# Patient Record
Sex: Female | Born: 1960 | Race: White | Hispanic: No | Smoking: Former smoker
Health system: Southern US, Community
[De-identification: ages and names within clinical notes are randomized; demographics above are authoritative.]

## PROBLEM LIST (undated history)

## (undated) DIAGNOSIS — M722 Plantar fascial fibromatosis: Secondary | ICD-10-CM

## (undated) DIAGNOSIS — R7303 Prediabetes: Secondary | ICD-10-CM

## (undated) DIAGNOSIS — K2960 Other gastritis without bleeding: Secondary | ICD-10-CM

## (undated) DIAGNOSIS — G43909 Migraine, unspecified, not intractable, without status migrainosus: Secondary | ICD-10-CM

## (undated) DIAGNOSIS — M810 Age-related osteoporosis without current pathological fracture: Secondary | ICD-10-CM

## (undated) DIAGNOSIS — K449 Diaphragmatic hernia without obstruction or gangrene: Secondary | ICD-10-CM

## (undated) DIAGNOSIS — Z87898 Personal history of other specified conditions: Secondary | ICD-10-CM

## (undated) DIAGNOSIS — M719 Bursopathy, unspecified: Secondary | ICD-10-CM

## (undated) DIAGNOSIS — B029 Zoster without complications: Secondary | ICD-10-CM

## (undated) DIAGNOSIS — E669 Obesity, unspecified: Secondary | ICD-10-CM

## (undated) HISTORY — PX: TONSILLECTOMY: SHX5217

## (undated) HISTORY — DX: Migraine, unspecified, not intractable, without status migrainosus: G43.909

## (undated) HISTORY — DX: Zoster without complications: B02.9

## (undated) HISTORY — PX: CERVIX LESION DESTRUCTION: SHX591

## (undated) HISTORY — DX: Diaphragmatic hernia without obstruction or gangrene: K44.9

## (undated) HISTORY — DX: Bursopathy, unspecified: M71.9

## (undated) HISTORY — DX: Other gastritis without bleeding: K29.60

## (undated) HISTORY — DX: Plantar fascial fibromatosis: M72.2

## (undated) HISTORY — DX: Personal history of other specified conditions: Z87.898

## (undated) HISTORY — DX: Prediabetes: R73.03

## (undated) HISTORY — DX: Age-related osteoporosis without current pathological fracture: M81.0

## (undated) HISTORY — DX: Obesity, unspecified: E66.9

---

## 1989-01-26 DIAGNOSIS — Z87898 Personal history of other specified conditions: Secondary | ICD-10-CM

## 1989-01-26 HISTORY — DX: Personal history of other specified conditions: Z87.898

## 1995-01-27 HISTORY — PX: BREAST EXCISIONAL BIOPSY: SUR124

## 1995-01-27 HISTORY — PX: BREAST SURGERY: SHX581

## 1997-01-26 HISTORY — PX: CERVICAL POLYPECTOMY: SHX88

## 1998-03-08 ENCOUNTER — Other Ambulatory Visit: Admission: RE | Admit: 1998-03-08 | Discharge: 1998-03-08 | Payer: Self-pay | Admitting: Obstetrics and Gynecology

## 1998-08-02 ENCOUNTER — Ambulatory Visit (HOSPITAL_COMMUNITY): Admission: RE | Admit: 1998-08-02 | Discharge: 1998-08-02 | Payer: Self-pay | Admitting: Obstetrics and Gynecology

## 1998-08-02 ENCOUNTER — Encounter (INDEPENDENT_AMBULATORY_CARE_PROVIDER_SITE_OTHER): Payer: Self-pay | Admitting: *Deleted

## 2000-07-21 ENCOUNTER — Other Ambulatory Visit: Admission: RE | Admit: 2000-07-21 | Discharge: 2000-07-21 | Payer: Self-pay | Admitting: Obstetrics and Gynecology

## 2001-07-15 ENCOUNTER — Other Ambulatory Visit: Admission: RE | Admit: 2001-07-15 | Discharge: 2001-07-15 | Payer: Self-pay | Admitting: *Deleted

## 2002-07-31 ENCOUNTER — Other Ambulatory Visit: Admission: RE | Admit: 2002-07-31 | Discharge: 2002-07-31 | Payer: Self-pay | Admitting: Obstetrics and Gynecology

## 2003-08-07 ENCOUNTER — Other Ambulatory Visit: Admission: RE | Admit: 2003-08-07 | Discharge: 2003-08-07 | Payer: Self-pay | Admitting: Obstetrics and Gynecology

## 2004-08-12 ENCOUNTER — Other Ambulatory Visit: Admission: RE | Admit: 2004-08-12 | Discharge: 2004-08-12 | Payer: Self-pay | Admitting: Obstetrics and Gynecology

## 2005-03-17 ENCOUNTER — Other Ambulatory Visit: Admission: RE | Admit: 2005-03-17 | Discharge: 2005-03-17 | Payer: Self-pay | Admitting: Obstetrics & Gynecology

## 2006-04-09 ENCOUNTER — Other Ambulatory Visit: Admission: RE | Admit: 2006-04-09 | Discharge: 2006-04-09 | Payer: Self-pay | Admitting: Obstetrics & Gynecology

## 2007-05-17 ENCOUNTER — Other Ambulatory Visit: Admission: RE | Admit: 2007-05-17 | Discharge: 2007-05-17 | Payer: Self-pay | Admitting: Obstetrics and Gynecology

## 2008-05-22 ENCOUNTER — Other Ambulatory Visit: Admission: RE | Admit: 2008-05-22 | Discharge: 2008-05-22 | Payer: Self-pay | Admitting: Obstetrics and Gynecology

## 2008-06-26 DIAGNOSIS — B029 Zoster without complications: Secondary | ICD-10-CM

## 2008-06-26 HISTORY — DX: Zoster without complications: B02.9

## 2008-11-27 ENCOUNTER — Encounter: Admission: RE | Admit: 2008-11-27 | Discharge: 2008-11-27 | Payer: Self-pay | Admitting: Obstetrics and Gynecology

## 2009-04-25 ENCOUNTER — Encounter: Admission: RE | Admit: 2009-04-25 | Discharge: 2009-04-25 | Payer: Self-pay | Admitting: Orthopedic Surgery

## 2009-12-03 ENCOUNTER — Encounter: Admission: RE | Admit: 2009-12-03 | Discharge: 2009-12-03 | Payer: Self-pay | Admitting: Family Medicine

## 2010-09-22 HISTORY — PX: COLONOSCOPY W/ BIOPSIES: SHX1374

## 2010-09-22 LAB — HM COLONOSCOPY

## 2010-12-15 ENCOUNTER — Other Ambulatory Visit: Payer: Self-pay | Admitting: Family Medicine

## 2010-12-15 DIAGNOSIS — Z1231 Encounter for screening mammogram for malignant neoplasm of breast: Secondary | ICD-10-CM

## 2011-01-05 ENCOUNTER — Ambulatory Visit
Admission: RE | Admit: 2011-01-05 | Discharge: 2011-01-05 | Disposition: A | Discharge status: Home / Self Care | Payer: Commercial Indemnity | Source: Ambulatory Visit | Attending: Family Medicine | Admitting: Family Medicine

## 2011-01-05 DIAGNOSIS — Z1231 Encounter for screening mammogram for malignant neoplasm of breast: Secondary | ICD-10-CM

## 2011-01-07 ENCOUNTER — Other Ambulatory Visit: Payer: Self-pay | Admitting: Family Medicine

## 2011-01-07 DIAGNOSIS — R928 Other abnormal and inconclusive findings on diagnostic imaging of breast: Secondary | ICD-10-CM

## 2011-01-21 ENCOUNTER — Ambulatory Visit
Admission: RE | Admit: 2011-01-21 | Discharge: 2011-01-21 | Disposition: A | Discharge status: Home / Self Care | Payer: Commercial Indemnity | Source: Ambulatory Visit | Attending: Family Medicine | Admitting: Family Medicine

## 2011-01-21 DIAGNOSIS — R928 Other abnormal and inconclusive findings on diagnostic imaging of breast: Secondary | ICD-10-CM

## 2011-01-22 ENCOUNTER — Other Ambulatory Visit: Payer: Self-pay | Admitting: Family Medicine

## 2011-01-22 DIAGNOSIS — R921 Mammographic calcification found on diagnostic imaging of breast: Secondary | ICD-10-CM

## 2011-12-01 ENCOUNTER — Other Ambulatory Visit: Payer: Self-pay | Admitting: Family Medicine

## 2011-12-01 DIAGNOSIS — R921 Mammographic calcification found on diagnostic imaging of breast: Secondary | ICD-10-CM

## 2012-01-06 ENCOUNTER — Ambulatory Visit
Admission: RE | Admit: 2012-01-06 | Discharge: 2012-01-06 | Disposition: A | Discharge status: Home / Self Care | Payer: Commercial Indemnity | Source: Ambulatory Visit | Attending: Family Medicine | Admitting: Family Medicine

## 2012-01-06 DIAGNOSIS — R921 Mammographic calcification found on diagnostic imaging of breast: Secondary | ICD-10-CM

## 2012-09-23 ENCOUNTER — Ambulatory Visit (INDEPENDENT_AMBULATORY_CARE_PROVIDER_SITE_OTHER): Payer: Commercial Indemnity | Admitting: Nurse Practitioner

## 2012-09-23 ENCOUNTER — Encounter: Payer: Self-pay | Admitting: Nurse Practitioner

## 2012-09-23 VITALS — BP 106/66 | HR 60 | Resp 16 | Ht 64.25 in | Wt 185.0 lb

## 2012-09-23 DIAGNOSIS — R109 Unspecified abdominal pain: Secondary | ICD-10-CM

## 2012-09-23 DIAGNOSIS — E559 Vitamin D deficiency, unspecified: Secondary | ICD-10-CM

## 2012-09-23 DIAGNOSIS — R920 Mammographic microcalcification found on diagnostic imaging of breast: Secondary | ICD-10-CM

## 2012-09-23 DIAGNOSIS — Z0000 Encounter for general adult medical examination without abnormal findings: Secondary | ICD-10-CM

## 2012-09-23 DIAGNOSIS — Z01419 Encounter for gynecological examination (general) (routine) without abnormal findings: Secondary | ICD-10-CM

## 2012-09-23 DIAGNOSIS — G8929 Other chronic pain: Secondary | ICD-10-CM

## 2012-09-23 LAB — POCT URINALYSIS DIPSTICK
Bilirubin, UA: NEGATIVE
Blood, UA: NEGATIVE
Glucose, UA: NEGATIVE
Ketones, UA: NEGATIVE

## 2012-09-23 MED ORDER — VITAMIN D (ERGOCALCIFEROL) 1.25 MG (50000 UNIT) PO CAPS: 50000.0000 [IU] | ORAL_CAPSULE | ORAL | Status: DC

## 2012-09-23 NOTE — Progress Notes (Signed)
Patient ID: Kayla Lewis, female   DOB: 1961/01/17, 52 y.o.   MRN: [id number] 52 y.o. G2P2002 Divorced Caucasian Fe here for annual exam.  Some increase in vaginal dryness. Not dating or sexually active.  Recent problems with Sciatica of right hip X 6 months.  Has been in PT and working with Systems analyst. Left flank pain for 2 months.  No urinary symptoms.  Remote history of renal calculi in her late 20's.  No LMP recorded. Patient is postmenopausal.          Sexually active: no  The current method of family planning is abstinence.    Exercising: yes  Gym/ health club routine includes cardio and light weights. 3 times per week Smoker:  no  Health Maintenance: Pap:  09/21/11, WNL HR HPV neg MMG:  01/06/12 order placed for repeat Mammo Colonoscopy:  09/22/10, repeat 5 years BMD:   2-3 years ago osteopenia at hip - done at Ortho and Dr. Tresa Endo Shingles Vaccine: 03/26/2009 TDaP:  03/26/09 Labs: HB 13.7  Urine: negative     No past medical history on file.  No past surgical history on file.  No current outpatient prescriptions on file.   No current facility-administered medications for this visit.    No family history on file.  ROS:  Pertinent items are noted in HPI.  Otherwise, a comprehensive ROS was negative.  Exam:   There were no vitals taken for this visit.    Ht Readings from Last 3 Encounters:  No data found for Ht    General appearance: alert, cooperative and appears stated age Head: Normocephalic, without obvious abnormality, atraumatic Neck: no adenopathy, supple, symmetrical, trachea midline and thyroid normal to inspection and palpation Lungs: clear to auscultation bilaterally Breasts: normal appearance, no masses or tenderness Heart: regular rate and rhythm Abdomen: soft, non-tender; no masses,  no organomegaly, no flank pain Extremities: extremities normal, atraumatic, no cyanosis or edema Skin: Skin color, texture, turgor normal. No rashes or lesions Lymph  nodes: Cervical, supraclavicular, and axillary nodes normal. No abnormal inguinal nodes palpated Neurologic: Grossly normal   Pelvic: External genitalia:  no lesions              Urethra:  normal appearing urethra with no masses, tenderness or lesions              Bartholin's and Skene's: normal                 Vagina: normal appearing vagina with normal color and discharge, no lesions              Cervix: anteverted              Pap taken: no Bimanual Exam:  Uterus:  normal size, contour, position, consistency, mobility, non-tender              Adnexa: no mass, fullness, tenderness               Rectovaginal: Confirms               Anus:  normal sphincter tone, no lesions  A:  Well Woman with normal exam  Postmenopausal  Atrophic vaginitis  Right Sciatica  Left flank pain  history of Vit D deficiency  History of abnormal Mammo with calcifications on the left  P:   Pap smear as per guidelines   Mammogram due 12/14 - order is placed for diagnostic  Refill Vit D RX - will depend on labs  Will  get Urine CS and follow - she is planning to follow with PCP in 2 wk's.  Counseled on breast self exam, osteoporosis, adequate intake of calcium and vitamin D, diet and exercise, Kegel's exercises return annually or prn  An After Visit Summary was printed and given to the patient.

## 2012-09-23 NOTE — Patient Instructions (Signed)

## 2012-09-24 LAB — URINE CULTURE
Colony Count: NO GROWTH
Organism ID, Bacteria: NO GROWTH

## 2012-09-24 LAB — VITAMIN D 25 HYDROXY (VIT D DEFICIENCY, FRACTURES): Vit D, 25-Hydroxy: 49 ng/mL (ref 30–89)

## 2012-09-26 NOTE — Progress Notes (Signed)
Encounter reviewed by Dr. Brook Silva.  

## 2012-10-01 ENCOUNTER — Other Ambulatory Visit: Payer: Self-pay | Admitting: Nurse Practitioner

## 2013-01-13 ENCOUNTER — Ambulatory Visit
Admission: RE | Admit: 2013-01-13 | Discharge: 2013-01-13 | Disposition: A | Discharge status: Home / Self Care | Payer: Commercial Indemnity | Source: Ambulatory Visit | Attending: Nurse Practitioner | Admitting: Nurse Practitioner

## 2013-01-13 DIAGNOSIS — R920 Mammographic microcalcification found on diagnostic imaging of breast: Secondary | ICD-10-CM

## 2013-01-13 DIAGNOSIS — Z01419 Encounter for gynecological examination (general) (routine) without abnormal findings: Secondary | ICD-10-CM

## 2013-09-28 ENCOUNTER — Ambulatory Visit (INDEPENDENT_AMBULATORY_CARE_PROVIDER_SITE_OTHER): Payer: Commercial Indemnity | Admitting: Nurse Practitioner

## 2013-09-28 ENCOUNTER — Encounter: Payer: Self-pay | Admitting: Nurse Practitioner

## 2013-09-28 VITALS — BP 120/78 | HR 68 | Ht 64.25 in | Wt 190.0 lb

## 2013-09-28 DIAGNOSIS — M899 Disorder of bone, unspecified: Secondary | ICD-10-CM

## 2013-09-28 DIAGNOSIS — E559 Vitamin D deficiency, unspecified: Secondary | ICD-10-CM

## 2013-09-28 DIAGNOSIS — M949 Disorder of cartilage, unspecified: Secondary | ICD-10-CM

## 2013-09-28 DIAGNOSIS — Z1211 Encounter for screening for malignant neoplasm of colon: Secondary | ICD-10-CM

## 2013-09-28 DIAGNOSIS — M8580 Other specified disorders of bone density and structure, unspecified site: Secondary | ICD-10-CM

## 2013-09-28 DIAGNOSIS — Z01419 Encounter for gynecological examination (general) (routine) without abnormal findings: Secondary | ICD-10-CM

## 2013-09-28 NOTE — Progress Notes (Signed)
Patient ID: Kayla Lewis, female   DOB: 11-23-1960, 53 y.o.   MRN: [id number] 53 y.o. G13P2002 Divorced Caucasian Fe here for annual exam.  She is not dating or SA.  Still having migraine headaches.  Now cutting back on glutin, dairy and breads.  Some times related to changes in weather pressure.   Patient's last menstrual period was 05/27/2007.          Sexually active: no  The current method of family planning is abstinence.  Exercising: yes Gym/ health club routine includes cardio and light weights, running and walking 5 days per week. Still unable to lose weight. Smoker: no   Health Maintenance:  Pap: 09/21/11, WNL HR HPV neg  MMG: 01/13/13, Diagnostic, Bi-Rads 2: benign findings, repeat in one year Colonoscopy: 09/22/10, repeat 5 years  BMD: 2-3 years ago osteopenia at hip - done at Ortho and Dr. Tresa Endo  Shingles Vaccine: 03/26/2009  TDaP: 03/26/09  Labs:  PCP takes care of all labs.   reports that she quit smoking about 15 years ago. Her smoking use included Cigarettes. She has a 1.25 pack-year smoking history. She has never used smokeless tobacco. She reports that she does not drink alcohol or use illicit drugs.  Past Medical History  Diagnosis Date  . Migraine age 46's    with aura  . History of abnormal Pap smear 1991  . Shingles outbreak 06/2008    abdomen    Past Surgical History  Procedure Laterality Date  . Cervix lesion destruction    . Tonsillectomy  age 56  . Breast surgery Left 1997    lumpectomy benign  . Cervical polypectomy  1999  . Colonoscopy w/ biopsies  09/22/10    1 polyp benign recheck in 10 years.    Current Outpatient Prescriptions  Medication Sig Dispense Refill  . cetirizine (ZYRTEC) 10 MG tablet Take 10 mg by mouth daily.      Marland Kitchen LOVAZA 1 G capsule Take 1 capsule by mouth daily.      . Magnesium 250 MG TABS Take 1 tablet by mouth daily.      . Multiple Vitamin (MULTIVITAMIN) tablet Take 1 tablet by mouth daily.      . niacin 250 MG tablet Take 250 mg  by mouth daily with breakfast.      . sulfamethoxazole-trimethoprim (BACTRIM DS) 800-160 MG per tablet Take 1 tablet by mouth daily.      . SUMAtriptan (IMITREX) 100 MG tablet Take 1 tablet by mouth as needed.      . topiramate (TOPAMAX) 200 MG tablet Take 1 tablet by mouth daily.       No current facility-administered medications for this visit.    Family History  Problem Relation Age of Onset  . COPD Father   . Depression Sister   . Hypertension Sister   . Obesity Sister   . Hyperlipidemia Sister   . Heart disease Sister   . Asthma Sister   . Obesity Sister     ROS:  Pertinent items are noted in HPI.  Otherwise, a comprehensive ROS was negative.  Exam:   BP 120/78  Pulse 68  Ht 5' 4.25" (1.632 m)  Wt 190 lb (86.183 kg)  BMI 32.36 kg/m2  LMP 05/27/2007 Height: 5' 4.25" (163.2 cm)  Ht Readings from Last 3 Encounters:  09/28/13 5' 4.25" (1.632 m)  09/23/12 5' 4.25" (1.632 m)    General appearance: alert, cooperative and appears stated age Head: Normocephalic, without obvious abnormality, atraumatic Neck:  no adenopathy, supple, symmetrical, trachea midline and thyroid normal to inspection and palpation Lungs: clear to auscultation bilaterally Breasts: normal appearance, no masses or tenderness Heart: regular rate and rhythm Abdomen: soft, non-tender; no masses,  no organomegaly Extremities: extremities normal, atraumatic, no cyanosis or edema Skin: Skin color, texture, turgor normal. No rashes or lesions Lymph nodes: Cervical, supraclavicular, and axillary nodes normal. No abnormal inguinal nodes palpated Neurologic: Grossly normal   Pelvic: External genitalia:  no lesions              Urethra:  normal appearing urethra with no masses, tenderness or lesions              Bartholin's and Skene's: normal                 Vagina: normal appearing vagina with normal color and discharge, no lesions              Cervix: anteverted              Pap taken: No. Bimanual Exam:   Uterus:  normal size, contour, position, consistency, mobility, non-tender              Adnexa: no mass, fullness, tenderness               Rectovaginal: Confirms               Anus:  normal sphincter tone, no lesions  A:  Well Woman with normal exam  Postmenopausal no HRT  Atrophic vaginitis declines vag Estrogen  Right Sciatica - better  History of Vit D deficiency   History of abnormal Mammo with calcifications on the left last Mammo was diagnostic, in December will be screening  History of Osteopenia - GI intolerance to Fosamax, and non compliance to nasal spray.   P:   Reviewed health and wellness pertinent to exam  Pap smear not taken today  Mammogram due 12/15  Will get a repeat BMD and try to get previous one done at [facility] for comparison.  IFOB given today - desires screening yearly  She will be sure and get Vit D checked at PCP  Counseled on breast self exam, mammography screening, adequate intake of calcium and vitamin D, diet and exercise, Kegel's exercises return annually or prn  An After Visit Summary was printed and given to the patient.

## 2013-09-28 NOTE — Patient Instructions (Addendum)

## 2013-10-02 NOTE — Progress Notes (Signed)
Encounter reviewed by Dr. Brook Silva.  

## 2013-11-08 DIAGNOSIS — N951 Menopausal and female climacteric states: Secondary | ICD-10-CM | POA: Insufficient documentation

## 2013-11-08 DIAGNOSIS — M25551 Pain in right hip: Secondary | ICD-10-CM | POA: Insufficient documentation

## 2013-11-08 DIAGNOSIS — L719 Rosacea, unspecified: Secondary | ICD-10-CM | POA: Insufficient documentation

## 2013-11-08 DIAGNOSIS — K3189 Other diseases of stomach and duodenum: Secondary | ICD-10-CM | POA: Insufficient documentation

## 2013-11-08 DIAGNOSIS — K2940 Chronic atrophic gastritis without bleeding: Secondary | ICD-10-CM | POA: Insufficient documentation

## 2013-11-08 DIAGNOSIS — J309 Allergic rhinitis, unspecified: Secondary | ICD-10-CM | POA: Insufficient documentation

## 2013-11-08 DIAGNOSIS — M8580 Other specified disorders of bone density and structure, unspecified site: Secondary | ICD-10-CM | POA: Insufficient documentation

## 2013-11-08 DIAGNOSIS — K30 Functional dyspepsia: Secondary | ICD-10-CM | POA: Insufficient documentation

## 2013-11-08 HISTORY — DX: Pain in right hip: M25.551

## 2013-11-23 ENCOUNTER — Telehealth: Payer: Self-pay | Admitting: Nurse Practitioner

## 2013-11-23 NOTE — Telephone Encounter (Signed)
Since we do not deal with weight management and give med's for weight loss - advise her to see PCP and discuss further.

## 2013-11-23 NOTE — Telephone Encounter (Signed)
PCP takes care of labs - make sure she gets thyroid checked.

## 2013-11-23 NOTE — Telephone Encounter (Signed)
Spoke with patient. Patient states "My biggest concern lately has been my weight. I spoke with Patty some about this at my annual but not about how it was such a concern. I have been logging my food, working out with a trainer 2 times a week, and going to the gym 5 days a week. I am still getting bigger. I had my cholesterol checked at work at it was 226. With everything I am doing nothing is changing. Is there something she could give me to help me lose weight?" Advised patient will send a message over to Lauro FranklinPatricia Rolen-Grubb, FNP for review and advise and return call with recommendations. Patient is agreeable and verbalizes understanding.

## 2013-11-23 NOTE — Telephone Encounter (Signed)
Patient called during lunch requesting a nurse to call her

## 2013-11-24 NOTE — Telephone Encounter (Signed)
Spoke with patient. Advised of message as seen below from Lauro FranklinPatricia Rolen-Grubb, FNP. Patient is agreeable and verbalizes understanding. Will call PCP and have thyroid level checked.  Routing to provider for final review. Patient agreeable to disposition. Will close encounter

## 2013-11-27 ENCOUNTER — Encounter: Payer: Self-pay | Admitting: Nurse Practitioner

## 2013-12-19 ENCOUNTER — Other Ambulatory Visit: Payer: Self-pay

## 2013-12-19 DIAGNOSIS — Z1231 Encounter for screening mammogram for malignant neoplasm of breast: Secondary | ICD-10-CM

## 2014-01-16 ENCOUNTER — Ambulatory Visit
Admission: RE | Admit: 2014-01-16 | Discharge: 2014-01-16 | Disposition: A | Discharge status: Home / Self Care | Payer: Commercial Indemnity | Source: Ambulatory Visit

## 2014-01-16 DIAGNOSIS — Z1231 Encounter for screening mammogram for malignant neoplasm of breast: Secondary | ICD-10-CM

## 2014-04-02 ENCOUNTER — Telehealth: Payer: Self-pay | Admitting: *Deleted

## 2014-04-02 NOTE — Telephone Encounter (Deleted)
Charted in incorrect patient chart.

## 2014-04-02 NOTE — Telephone Encounter (Deleted)
John, thanks.  Rene KocherRegina, could you, please, notify this pt concerning scheduling colonoscopy. She is leaving the country on 4/19.We have reviewed procedure notes from Dr Kinnie ScalesMedoff and from Dr Hilbert OdorGromig and she has been cleared by Jonny RuizJohn to undergo the procedure in [facility] where her insurancewill cover. Thanx.       Previous Messages     Left a message for patient to call back.

## 2014-05-28 LAB — FECAL OCCULT BLOOD, IMMUNOCHEMICAL: IMMUNOLOGICAL FECAL OCCULT BLOOD TEST: NEGATIVE

## 2014-05-28 NOTE — Addendum Note (Signed)
Addended by: Eliezer BottomJOHNSON, DAVINA J on: 05/28/2014 12:59 PM   Modules accepted: Orders

## 2014-05-30 ENCOUNTER — Telehealth: Payer: Self-pay | Admitting: Nurse Practitioner

## 2014-05-30 NOTE — Telephone Encounter (Signed)
Patient calling to check on the status of her "stool sample test."

## 2014-05-30 NOTE — Telephone Encounter (Signed)
Notes Recorded by Roanna BanningPatricia R Grubb, FNP on 05/28/2014 at 1:40 PM Please let pt. Know that IFOB was negative.  Left message to call Amariona Rathje at [phone].

## 2014-05-30 NOTE — Telephone Encounter (Signed)
agree

## 2014-05-30 NOTE — Telephone Encounter (Signed)
Spoke with patient. Advised of results as seen below. Patient is agreeable and verbalizes understanding.  Routing to provider for final review. Patient agreeable to disposition. Patient aware MD will review message and nurse will return call with any additional instructions or change of disposition. Will close encounter.

## 2014-08-10 DIAGNOSIS — R5383 Other fatigue: Secondary | ICD-10-CM | POA: Insufficient documentation

## 2014-10-04 ENCOUNTER — Ambulatory Visit: Payer: Commercial Indemnity | Admitting: Nurse Practitioner

## 2014-10-17 ENCOUNTER — Encounter: Payer: Self-pay | Admitting: Nurse Practitioner

## 2014-10-17 ENCOUNTER — Ambulatory Visit (INDEPENDENT_AMBULATORY_CARE_PROVIDER_SITE_OTHER): Payer: Commercial Indemnity | Admitting: Nurse Practitioner

## 2014-10-17 VITALS — BP 112/76 | HR 72 | Ht 64.25 in | Wt 190.0 lb

## 2014-10-17 DIAGNOSIS — Z01419 Encounter for gynecological examination (general) (routine) without abnormal findings: Secondary | ICD-10-CM

## 2014-10-17 DIAGNOSIS — Z1211 Encounter for screening for malignant neoplasm of colon: Secondary | ICD-10-CM

## 2014-10-17 DIAGNOSIS — M810 Age-related osteoporosis without current pathological fracture: Secondary | ICD-10-CM | POA: Diagnosis not present

## 2014-10-17 DIAGNOSIS — Z0000 Encounter for general adult medical examination without abnormal findings: Secondary | ICD-10-CM

## 2014-10-17 LAB — POCT URINALYSIS DIPSTICK
BILIRUBIN UA: NEGATIVE
GLUCOSE UA: NEGATIVE
KETONES UA: NEGATIVE
Leukocytes, UA: NEGATIVE
Nitrite, UA: NEGATIVE
PH UA: 6
Protein, UA: NEGATIVE
RBC UA: NEGATIVE
Urobilinogen, UA: NEGATIVE

## 2014-10-17 NOTE — Patient Instructions (Signed)

## 2014-10-17 NOTE — Progress Notes (Signed)
Patient ID: ICESS BERTONI, female   DOB: [birth year], 54 y.o.   MRN: [id number] 54 y.o. G55P2002 Divorced  Caucasian Fe here for annual exam.  She feels well other than weight gain despite diet, exercise, and personal trainer 3 times a week.  She talked with PCP and he felt she was depressed - she does not feel that way.  She has discussed with family members and they do not feel she is depressed.  She is not dating and has had a nice summer.  Some mild vaso symptoms.  Patient's last menstrual period was 05/27/2007.          Sexually active: No.  The current method of family planning is none.    Exercising: Yes.    cardio and weight training 5 times per week Smoker:  no  Health Maintenance: Pap: 09/21/11, WNL HR HPV neg  MMG: 01/16/14, 3D, Bi-Rads 1:  Negative  Colonoscopy: 09/22/10, benign polyp, repeat in 10 years BMD:  09/22/11, T-Score -2.1 S / -2.7 left proximal femur / -2.0 left femoral neck Shingles Vaccine: 03/26/2009  TDaP: 03/26/09  Labs: PCP   Urine:  negative   reports that she quit smoking about 16 years ago. Her smoking use included Cigarettes. She has a 1.25 pack-year smoking history. She has never used smokeless tobacco. She reports that she does not drink alcohol or use illicit drugs.  Past Medical History  Diagnosis Date  . Migraine age 61's    with aura  . History of abnormal Pap smear 1991  . Shingles outbreak 06/2008    abdomen    Past Surgical History  Procedure Laterality Date  . Cervix lesion destruction    . Tonsillectomy  age 47  . Breast surgery Left 1997    lumpectomy benign  . Cervical polypectomy  1999  . Colonoscopy w/ biopsies  09/22/10    1 polyp benign recheck in 10 years.    Current Outpatient Prescriptions  Medication Sig Dispense Refill  . Calcium Carbonate-Vit D-Min (CALTRATE MINIS PLUS MINERALS) 300-800 MG-UNIT TABS Take 2 tablets by mouth daily.    . cetirizine (ZYRTEC) 10 MG tablet Take 10 mg by mouth daily.    . Cholecalciferol (VITAMIN  D3) 2000 UNITS capsule Take 1 capsule by mouth daily.    . Cyanocobalamin (RA VITAMIN B-12 TR) 1000 MCG TBCR Take 1 tablet by mouth daily.    Marland Kitchen LOVAZA 1 G capsule Take 1 capsule by mouth daily.    . Magnesium 250 MG TABS Take 1 tablet by mouth daily.    . metroNIDAZOLE (METROGEL) 0.75 % gel Apply topically as needed. roscea    . Multiple Vitamin (MULTIVITAMIN) tablet Take 1 tablet by mouth daily.    . niacin 250 MG tablet Take 250 mg by mouth daily with breakfast.    . rizatriptan (MAXALT) 10 MG tablet Take 10 mg by mouth as needed.    . topiramate (TOPAMAX) 200 MG tablet Take 1 tablet by mouth daily.     No current facility-administered medications for this visit.    Family History  Problem Relation Age of Onset  . COPD Father   . Depression Sister   . Hypertension Sister   . Obesity Sister   . Hyperlipidemia Sister   . Heart disease Sister   . Asthma Sister   . Obesity Sister     ROS:  Pertinent items are noted in HPI.  Otherwise, a comprehensive ROS was negative.  Exam:   BP 112/76 mmHg  Pulse 72  Ht 5' 4.25" (1.632 m)  Wt 190 lb (86.183 kg)  BMI 32.36 kg/m2  LMP 05/27/2007 Height: 5' 4.25" (163.2 cm) Ht Readings from Last 3 Encounters:  10/17/14 5' 4.25" (1.632 m)  09/28/13 5' 4.25" (1.632 m)  09/23/12 5' 4.25" (1.632 m)    General appearance: alert, cooperative and appears stated age Head: Normocephalic, without obvious abnormality, atraumatic Neck: no adenopathy, supple, symmetrical, trachea midline and thyroid normal to inspection and palpation Lungs: clear to auscultation bilaterally Breasts: normal appearance, no masses or tenderness Heart: regular rate and rhythm Abdomen: soft, non-tender; no masses,  no organomegaly Extremities: extremities normal, atraumatic, no cyanosis or edema Skin: Skin color, texture, turgor normal. No rashes or lesions Lymph nodes: Cervical, supraclavicular, and axillary nodes normal. No abnormal inguinal nodes palpated Neurologic:  Grossly normal   Pelvic: External genitalia:  Very white lesion on right labia minora - after rubbing the area to make sure no underlying lesion or blister it becomes more pink.  Consulted with Dr. Oscar La and will recheck this in a month.                 Urethra:  normal appearing urethra with no masses, tenderness or lesions              Bartholin's and Skene's: normal                 Vagina: normal appearing vagina with normal color and discharge, no lesions              Cervix: anteverted              Pap taken: Yes.   Bimanual Exam:  Uterus:  normal size, contour, position, consistency, mobility, non-tender              Adnexa: no mass, fullness, tenderness               Rectovaginal: Confirms               Anus:  normal sphincter tone, no lesions  Chaperone present: yes  A:  Well Woman with normal exam  Postmenopausal no HRT Atrophic vaginitis declines vag Estrogen ? LSA of right vulva History of Vit D deficiency  History calcifications of the left breast with normal  Mammo 12/15 History of Osteopenia/ osteoporosis of the hip - GI intolerance to Fosamax, and non compliance to nasal spray.   P:   Reviewed health and wellness pertinent to exam  Pap smear as above  Mammogram is due 12/16  Will check PTH and calcium levels today  IFOB is given  She will fax her labs from PCP and I will review and add labs if necessary - will add FSH.  She will return in 1 month and have a visit with Dr. Oscar La to look at right vulva again to see if she needs biopsy.  In the meantime she was instructed to use Vaseline prn  Counseled on breast self exam, mammography screening, adequate intake of calcium and vitamin D, diet and exercise, Kegel's exercises return annually or prn  An After Visit Summary was printed and given to the patient.

## 2014-10-18 LAB — PTH, INTACT AND CALCIUM
CALCIUM: 9.6 mg/dL (ref 8.4–10.5)
PTH: 62 pg/mL (ref 14–64)

## 2014-10-19 ENCOUNTER — Telehealth: Payer: Self-pay | Admitting: Nurse Practitioner

## 2014-10-19 ENCOUNTER — Other Ambulatory Visit: Payer: Self-pay | Admitting: Nurse Practitioner

## 2014-10-19 DIAGNOSIS — M810 Age-related osteoporosis without current pathological fracture: Secondary | ICD-10-CM

## 2014-10-19 LAB — T3, FREE: T3, Free: 2.8 pg/mL (ref 2.3–4.2)

## 2014-10-19 LAB — T4: T4, Total: 9.2 ug/dL (ref 4.5–12.0)

## 2014-10-19 LAB — IPS PAP TEST WITH HPV

## 2014-10-19 NOTE — Addendum Note (Signed)
Addended by: Luisa Dago on: 10/19/2014 10:31 AM   Modules accepted: Orders

## 2014-10-19 NOTE — Telephone Encounter (Signed)
Pt. Is notified that we did get labs from PCP.  A few labs that we wanted to do for osteoporosis was not included and will be added to labs done on Wednesday.  She is notified that the PTH and calcium levels were normal but will await other labs and send her a note about our recommendations then.  She is OK with plan.

## 2014-10-20 LAB — VITAMIN D 25 HYDROXY (VIT D DEFICIENCY, FRACTURES): VIT D 25 HYDROXY: 33 ng/mL (ref 30–100)

## 2014-10-20 LAB — FOLLICLE STIMULATING HORMONE: FSH: 79.6 m[IU]/mL

## 2014-10-21 NOTE — Progress Notes (Signed)
Encounter reviewed by Dr. Brook Amundson C. Silva.  

## 2014-10-22 ENCOUNTER — Telehealth: Payer: Self-pay | Admitting: Nurse Practitioner

## 2014-10-22 ENCOUNTER — Encounter: Payer: Self-pay | Admitting: Nurse Practitioner

## 2014-10-22 NOTE — Telephone Encounter (Signed)
I can't find a recent DEXA in the chart. The last DEXA I can find was in 2013, it showed osteoporosis. Is this the DEXA she is coming to discuss, if not can you find me the report. Thanks

## 2014-10-22 NOTE — Telephone Encounter (Signed)
Spoke with patient. Advised per Ria Comment, FNP's note she would like patient to have consult with MD in the office to discuss BMD results and further treatment to decrease bone loss in the future. Patient has a follow up appointment with Dr.Jertson scheduled for 10/24 for something separate. Would like to consult with Dr.Jertson regarding BMD as to not have to switch back and forth between MD's. Appointment scheduled for BMD consult for 9/29 at 3 pm with Dr.Jerston. Agreeable to date and time.  Routing to provider for final review. Patient agreeable to disposition. Will close encounter.

## 2014-10-22 NOTE — Telephone Encounter (Signed)
Patient says Kayla Lewis want her to see Dr Edward Jolly about her bone density. Patient is not sure if she should have one done or just coming in to discuss results.

## 2014-10-22 NOTE — Telephone Encounter (Signed)
Left message to call Kaitlyn at [phone]. 

## 2014-10-23 NOTE — Telephone Encounter (Signed)
Per Ria Comment, FNP OV note from 10/17/2014 last BMD was performed in 2013 which revealed results below. Patient has a GI intolerance to Fosamax and non compliance to nasal spray. We checked her PTH and Calcium at her 9/21 visit both which were normal. Concerns are regarding why her BMD scores are so low at her age with these normal levels.  History of Osteopenia/ osteoporosis of the hip - GI intolerance to Fosamax, and non compliance to nasal spray.  Encounter previously closed.

## 2014-10-25 ENCOUNTER — Encounter: Payer: Self-pay | Admitting: Obstetrics and Gynecology

## 2014-10-25 ENCOUNTER — Ambulatory Visit (INDEPENDENT_AMBULATORY_CARE_PROVIDER_SITE_OTHER): Payer: Commercial Indemnity | Admitting: Obstetrics and Gynecology

## 2014-10-25 VITALS — BP 100/72 | HR 68 | Resp 14 | Wt 192.0 lb

## 2014-10-25 DIAGNOSIS — M8580 Other specified disorders of bone density and structure, unspecified site: Secondary | ICD-10-CM

## 2014-10-25 DIAGNOSIS — M810 Age-related osteoporosis without current pathological fracture: Secondary | ICD-10-CM

## 2014-10-25 HISTORY — DX: Other specified disorders of bone density and structure, unspecified site: M85.80

## 2014-10-25 LAB — BASIC METABOLIC PANEL
BUN: 19 mg/dL (ref 7–25)
CHLORIDE: 105 mmol/L (ref 98–110)
CO2: 27 mmol/L (ref 20–31)
Calcium: 9.5 mg/dL (ref 8.6–10.4)
Creat: 0.97 mg/dL (ref 0.50–1.05)
Glucose, Bld: 94 mg/dL (ref 65–99)
POTASSIUM: 4.6 mmol/L (ref 3.5–5.3)
Sodium: 138 mmol/L (ref 135–146)

## 2014-10-25 NOTE — Progress Notes (Addendum)
Patient ID: Kayla Lewis, female   DOB: [birth year], 54 y.o.   MRN: [id number] GYNECOLOGY  VISIT   HPI: 54 y.o.   Divorced  Caucasian  female   G2P2002 with Patient's last menstrual period was 05/27/2007.   here to discuss her osteoporosis  The patient was perimenopausal in her last 30's, LMP in her early 48's. She used to smoke, not for 10 years, only smoked socially for 5-6 years. No long term steroid use, not hypothyroid. She had muscle aches on fosomax. She was on for a couple of weeks.  GYNECOLOGIC HISTORY: Patient's last menstrual period was 05/27/2007. Contraception: Post menopause Menopausal hormone therapy: N/A        OB History    Gravida Para Term Preterm AB TAB SAB Ectopic Multiple Living   0 0 0 0 0 0 2         There are no active problems to display for this patient.   Past Medical History  Diagnosis Date  . Migraine age 50's    with aura  . History of abnormal Pap smear 1991  . Shingles outbreak 06/2008    abdomen    Past Surgical History  Procedure Laterality Date  . Cervix lesion destruction    . Tonsillectomy  age 39  . Breast surgery Left 1997    lumpectomy benign  . Cervical polypectomy  1999  . Colonoscopy w/ biopsies  09/22/10    1 polyp benign recheck in 10 years.    Current Outpatient Prescriptions  Medication Sig Dispense Refill  . Calcium Carbonate-Vit D-Min (CALTRATE MINIS PLUS MINERALS) 300-800 MG-UNIT TABS Take 2 tablets by mouth daily.    . cetirizine (ZYRTEC) 10 MG tablet Take 10 mg by mouth daily.    . Cholecalciferol (VITAMIN D3) 2000 UNITS capsule Take 1 capsule by mouth daily.    . Cyanocobalamin (RA VITAMIN B-12 TR) 1000 MCG TBCR Take 1 tablet by mouth daily.    Marland Kitchen LOVAZA 1 G capsule Take 1 capsule by mouth daily.    . Magnesium 250 MG TABS Take 1 tablet by mouth daily.    . metroNIDAZOLE (METROGEL) 0.75 % gel Apply topically as needed. roscea    . Multiple Vitamin (MULTIVITAMIN) tablet Take 1 tablet by mouth daily.    . niacin  250 MG tablet Take 250 mg by mouth daily with breakfast.    . rizatriptan (MAXALT) 10 MG tablet Take 10 mg by mouth as needed.    . SUMAtriptan (IMITREX) 100 MG tablet     . topiramate (TOPAMAX) 200 MG tablet Take 1 tablet by mouth daily.     No current facility-administered medications for this visit.     ALLERGIES: Codeine and Penicillins  Family History  Problem Relation Age of Onset  . COPD Father   . Depression Sister   . Hypertension Sister   . Obesity Sister   . Hyperlipidemia Sister   . Heart disease Sister   . Asthma Sister   . Obesity Sister     Social History   Social History  . Marital Status: Divorced    Spouse Name: N/A  . Number of Children: N/A  . Years of Education: N/A   Occupational History  . Not on file.   Social History Main Topics  . Smoking status: Former Smoker -- 0.25 packs/day for 5 years    Types: Cigarettes    Quit date: 01/26/1998  . Smokeless tobacco: Never Used     Comment: social  .  Alcohol Use: No  . Drug Use: No  . Sexual Activity: No   Other Topics Concern  . Not on file   Social History Narrative    Review of Systems  All other systems reviewed and are negative.   PHYSICAL EXAMINATION:    BP 100/72 mmHg  Pulse 68  Resp 14  Wt 192 lb (87.091 kg)  LMP 05/27/2007    General appearance: alert, cooperative and appears stated age  ASSESSMENT Osteoporosis Elevated creatinine on last blood work Otherwise normal CMP, CBC, TSH, vit D was just above normal at 33 Normal PTH  PLAN Repeat DEXA She hasn't tolerated Fosomax in the past Discussed prolia, she is concerned about side effects and inability to stop symptoms for 6 months Consider referral to hematology Discussed calcium 1200 mg a day Increase vit D by 1,000 IU a day Continue weight bearing exercise Discussed that because of her age and health she is not at as great a risk of fracture as she will be in the future.   An After Visit Summary was printed and  given to the patient.  15  minutes face to face time of which over 50% was spent in counseling.    Addendum: Above should read consider referral to rheumatology, not hematology

## 2014-10-25 NOTE — Patient Instructions (Signed)
Add Vit D 1,000 IU a day to your current intake of vit D

## 2014-10-29 ENCOUNTER — Telehealth: Payer: Self-pay | Admitting: Nurse Practitioner

## 2014-10-29 NOTE — Telephone Encounter (Signed)
Patient says someone called her but did not leave a message. No telephone call in system.

## 2014-10-30 ENCOUNTER — Telehealth: Payer: Self-pay | Admitting: Obstetrics and Gynecology

## 2014-10-30 NOTE — Telephone Encounter (Signed)
SHERRAL DIROCCO 10/30/2014  Telephone MRN:  [id number]   Description: 54 year old female Provider: Romualdo Bolk, MD Department: [facility]      Call Documentation      Romualdo Bolk, MD at 10/30/2014 10:11 AM    Status: Signed      Expand All Collapse All    Called the patient, reviewed normal labs. She was given reading information on osteoporosis at her last visit, hasn't had a chance to read it yet. After her DEXA we will further discuss. She hasn't tolerated Fosomax in the past, is concerned about prolia lasting for 6 months and possible side effects. I mentioned the option of Evista, discussed that it is not good at building bone, but can help prevent further loss. Explained it's a SERM, risk of blood clots. Will talk after her DEXA is back.

## 2014-10-30 NOTE — Telephone Encounter (Signed)
Patient returning your call Dr. Oscar La. Best # to reach [phone]

## 2014-10-30 NOTE — Telephone Encounter (Signed)
Called the patient, reviewed normal labs. She was given reading information on osteoporosis at her last visit, hasn't had a chance to read it yet. After her DEXA we will further discuss. She hasn't tolerated Fosomax in the past, is concerned about prolia lasting for 6 months and possible side effects. I mentioned the option of Evista, discussed that it is not good at building bone, but can help prevent further loss. Explained it's a SERM, risk of blood clots. Will talk after her DEXA is back.

## 2014-11-19 ENCOUNTER — Ambulatory Visit (INDEPENDENT_AMBULATORY_CARE_PROVIDER_SITE_OTHER): Payer: Commercial Indemnity | Admitting: Obstetrics and Gynecology

## 2014-11-19 ENCOUNTER — Encounter: Payer: Self-pay | Admitting: Obstetrics and Gynecology

## 2014-11-19 VITALS — BP 118/70 | HR 76 | Resp 14 | Wt 196.0 lb

## 2014-11-19 DIAGNOSIS — N762 Acute vulvitis: Secondary | ICD-10-CM | POA: Diagnosis not present

## 2014-11-19 DIAGNOSIS — N9089 Other specified noninflammatory disorders of vulva and perineum: Secondary | ICD-10-CM

## 2014-11-19 MED ORDER — BETAMETHASONE VALERATE 0.1 % EX OINT: TOPICAL_OINTMENT | CUTANEOUS | Status: DC

## 2014-11-19 NOTE — Progress Notes (Signed)
Patient ID: Kayla SinningLinda H Lewis, female   DOB: December 14, 1960, 54 y.o.   MRN: [id number] GYNECOLOGY  VISIT   HPI: 54 y.o.   Divorced  Caucasian  female   G2P2002 with Patient's last menstrual period was 05/27/2007.   here for 1 month recheck vulvar. The patient has noted intermittent vulvar itching or irritation. Last month she was noted to have some skin whittening on the right vulva. She has noticed some irritation on the right since then. She uses Vaseline daily.  She hasn't had her DEXA yet.  GYNECOLOGIC HISTORY: Patient's last menstrual period was 05/27/2007. Contraception:post menopause  Menopausal hormone therapy: N/A        OB History    Gravida Para Term Preterm AB TAB SAB Ectopic Multiple Living   2 2 2  0 0 0 0 0 0 2         Patient Active Problem List   Diagnosis Date Noted  . Osteoporosis 10/25/2014    Past Medical History  Diagnosis Date  . Migraine age 54's    with aura  . History of abnormal Pap smear 1991  . Shingles outbreak 06/2008    abdomen    Past Surgical History  Procedure Laterality Date  . Cervix lesion destruction    . Tonsillectomy  age 54  . Breast surgery Left 1997    lumpectomy benign  . Cervical polypectomy  1999  . Colonoscopy w/ biopsies  09/22/10    1 polyp benign recheck in 10 years.    Current Outpatient Prescriptions  Medication Sig Dispense Refill  . Cholecalciferol (VITAMIN D3) 3000 UNITS TABS Take 3,000 Units by mouth daily.    . Calcium Carbonate-Vit D-Min (CALTRATE MINIS PLUS MINERALS) 300-800 MG-UNIT TABS Take 2 tablets by mouth daily.    . cetirizine (ZYRTEC) 10 MG tablet Take 10 mg by mouth daily.    . Cyanocobalamin (RA VITAMIN B-12 TR) 1000 MCG TBCR Take 1 tablet by mouth daily.    Marland Kitchen. LOVAZA 1 G capsule Take 1 capsule by mouth daily.    . Magnesium 250 MG TABS Take 1 tablet by mouth daily.    . metroNIDAZOLE (METROGEL) 0.75 % gel Apply topically as needed. roscea    . Multiple Vitamin (MULTIVITAMIN) tablet Take 1 tablet by mouth  daily.    . naproxen (NAPROSYN) 500 MG tablet Take 500 mg by mouth 2 (two) times daily with a meal.  1  . niacin 250 MG tablet Take 250 mg by mouth daily with breakfast.    . RESTASIS 0.05 % ophthalmic emulsion INSTILL 1 DROP INTO AFFECTED EYE(S) BY OPHTHALMIC ROUTE EVERY 12 HOURS  1  . rizatriptan (MAXALT) 10 MG tablet Take 10 mg by mouth as needed.    . SUMAtriptan (IMITREX) 100 MG tablet     . topiramate (TOPAMAX) 200 MG tablet Take 1 tablet by mouth daily.     No current facility-administered medications for this visit.     ALLERGIES: Codeine and Penicillins  Family History  Problem Relation Age of Onset  . COPD Father   . Depression Sister   . Hypertension Sister   . Obesity Sister   . Hyperlipidemia Sister   . Heart disease Sister   . Asthma Sister   . Obesity Sister     Social History   Social History  . Marital Status: Divorced    Spouse Name: N/A  . Number of Children: N/A  . Years of Education: N/A   Occupational History  . Not on  file.   Social History Main Topics  . Smoking status: Former Smoker -- 0.25 packs/day for 5 years    Types: Cigarettes    Quit date: 01/26/1998  . Smokeless tobacco: Never Used     Comment: social  . Alcohol Use: No  . Drug Use: No  . Sexual Activity: No   Other Topics Concern  . Not on file   Social History Narrative    Review of Systems  Genitourinary:       Small white area in right vulvar   All other systems reviewed and are negative.   PHYSICAL EXAMINATION:    BP 118/70 mmHg  Pulse 76  Resp 14  Wt 196 lb (88.905 kg)  LMP 05/27/2007    General appearance: alert, cooperative and appears stated age  Pelvic: External genitalia:  On the inner/lower right labia minora, the skin is white and slightly raised, irregular borders. No agglutination or fissures.               Urethra:  normal appearing urethra with no masses, tenderness or lesions              Bartholins and Skenes: normal                 Vulvar  biopsy: The risks of the procedure were reviewed with the patient and a consent was signed. The area was cleansed with betadine and injected with 1% lidocaine. A 3 mm punch biopsy was used to remove a circular piece of tissue. The defect was closed with 4-0 vicryl. The patient tolerated the procedure well.    Chaperone was present for exam.  ASSESSMENT Vulvar lesion/irritation Vulvitis  PLAN Vulvar biopsy done Will treat with steroid ointment   An After Visit Summary was printed and given to the patient.    Addendum: we will set up her DEXA for her, we have discussed treatment, waiting for further information.

## 2014-11-20 ENCOUNTER — Telehealth: Payer: Self-pay | Admitting: Emergency Medicine

## 2014-11-20 NOTE — Telephone Encounter (Signed)
-----   Message from Romualdo BolkJill Evelyn Jertson, MD sent at 11/19/2014  4:40 PM EDT ----- The patient is suppose to have a dexa for osteopenia, please make sure this gets scheduled.  Thanks, Noreene LarssonJill

## 2014-11-20 NOTE — Telephone Encounter (Signed)
Calling patient to schedule Bone Density testing. Message left to return call to [facility] at [phone].    [place].  [facility]. [facility]

## 2014-11-20 NOTE — Telephone Encounter (Signed)
Patient returned call. She would like to schedule Bone Density at [facility]. Patient preference as this is close to her work.   Order form signed by Dr. Oscar LaJertson and faxed to [facility] at [phone]. Requested they call patient directly per her preference to know what is available. Fax confirmation received.  Routing to provider for final review. Patient agreeable to disposition. Will close encounter.

## 2014-11-26 ENCOUNTER — Telehealth: Payer: Self-pay | Admitting: Obstetrics and Gynecology

## 2014-11-26 NOTE — Telephone Encounter (Signed)
Spoke with patient. Patient asking when she can expect her stitches to dissolve. Advised patient it can take up to 2 weeks for the stitches to dissolve. Patient is agreeable. Denies any current discomfort. States the area is a little tender when she touches it, but no current discomfort. Aware if the stitches begin to bother her we can schedule an office visit to have them removed. Patient is agreeable.  Routing to provider for final review. Patient agreeable to disposition. Will close encounter.

## 2014-11-26 NOTE — Telephone Encounter (Signed)
Patient calling with questions about "dissolvable stitches."

## 2014-12-11 ENCOUNTER — Other Ambulatory Visit: Payer: Self-pay

## 2014-12-11 DIAGNOSIS — Z1231 Encounter for screening mammogram for malignant neoplasm of breast: Secondary | ICD-10-CM

## 2015-01-22 ENCOUNTER — Ambulatory Visit
Admission: RE | Admit: 2015-01-22 | Discharge: 2015-01-22 | Disposition: A | Discharge status: Home / Self Care | Payer: Managed Care, Other (non HMO) | Source: Ambulatory Visit

## 2015-01-22 DIAGNOSIS — Z1231 Encounter for screening mammogram for malignant neoplasm of breast: Secondary | ICD-10-CM

## 2015-02-19 ENCOUNTER — Telehealth: Payer: Self-pay | Admitting: Emergency Medicine

## 2015-02-19 NOTE — Telephone Encounter (Signed)
Chief Complaint  Patient presents with  . Appointment    Bone Density follow up with [facility]   Call to [facility]. Patient has not scheduled bone density.   Scheduler confirms they have order available from Dr. Oscar La.   They will contact her directly to schedule.   Will close encounter.

## 2015-03-27 ENCOUNTER — Encounter: Payer: Self-pay | Admitting: Rehabilitative and Restorative Service Providers"

## 2015-03-27 ENCOUNTER — Ambulatory Visit
Payer: Managed Care, Other (non HMO) | Attending: Orthopedic Surgery | Admitting: Rehabilitative and Restorative Service Providers"

## 2015-03-27 DIAGNOSIS — Z7409 Other reduced mobility: Secondary | ICD-10-CM | POA: Diagnosis present

## 2015-03-27 DIAGNOSIS — M25611 Stiffness of right shoulder, not elsewhere classified: Secondary | ICD-10-CM | POA: Diagnosis present

## 2015-03-27 DIAGNOSIS — M25511 Pain in right shoulder: Secondary | ICD-10-CM | POA: Insufficient documentation

## 2015-03-27 DIAGNOSIS — R293 Abnormal posture: Secondary | ICD-10-CM | POA: Diagnosis present

## 2015-03-27 NOTE — Patient Instructions (Addendum)
Axial Extension (Chin Tuck)    Pull chin in and lengthen back of neck. Hold _10___ seconds while counting out loud. Repeat __5__ times. Do _several ___ sessions per day.   Shoulder Blade Squeeze   Can use noodle along spine for tactile cue Rotate shoulders back, then squeeze shoulder blades down and back. Hold 10 sec Repeat _10___ times. Do _several___ sessions per day.  Scapula Adduction With Pectoralis Stretch: Low - Standing   Shoulders at 45 hands even with shoulders, keeping weight through legs, shift weight forward until you feel pull or stretch through the front of your chest. Hold _30__ seconds. Do _3__ times, _2-4__ times per day.   Scapula Adduction With Pectoralis Stretch: Mid-Range - Standing   Shoulders at 90 elbows even with shoulders, keeping weight through legs, shift weight forward until you feel pull or strength through the front of your chest. Hold __30_ seconds. Do _3__ times, __2-4_ times per day.   Scapula Adduction With Pectoralis Stretch: High - Standing   Shoulders at 120 hands up high on the doorway, keeping weight on feet, shift weight forward until you feel pull or stretch through the front of your chest. Hold _30__ seconds. Do _3__ times, _2-3__ times per day.  ELBOW: Biceps - Standing    Standing in doorway(can use counter top about waist height), place one hand on wall, elbow straight. Lean forward. Hold __30_ seconds. __3_ reps per set, _2-3_ sets per day  Self massage using ~4 inch rubber ball  Stretch pecs Work posterior shoulder girdle   TENS UNIT: This is helpful for muscle pain and spasm.   Search and Purchase a TENS 7000 2nd edition at www.tenspros.com. It should be less than $30.     TENS unit instructions: Do not shower or bathe with the unit on Turn the unit off before removing electrodes or batteries If the electrodes lose stickiness add a drop of water to the electrodes after they are disconnected from the unit and  place on plastic sheet. If you continued to have difficulty, call the TENS unit company to purchase more electrodes. Do not apply lotion on the skin area prior to use. Make sure the skin is clean and dry as this will help prolong the life of the electrodes. After use, always check skin for unusual red areas, rash or other skin difficulties. If there are any skin problems, does not apply electrodes to the same area. Never remove the electrodes from the unit by pulling the wires. Do not use the TENS unit or electrodes other than as directed. Do not change electrode placement without consultating your therapist or physician. Keep 2 fingers with between each electrode.

## 2015-03-27 NOTE — Therapy (Signed)
[facility] Outpatient Rehabilitation [facility] [address]  [address] [place], [place], [address] Phone: [phone]   Fax:  [phone]  Physical Therapy Evaluation  Patient Details  Name: Kayla Lewis MRN: [id number] Date of Birth: 03-26-1960 Referring Provider: Dr. Beverely Low  Encounter Date: 03/27/2015      PT End of Session - 03/27/15 0853    Visit Number 1   Number of Visits 12   Date for PT Re-Evaluation 05/08/15   PT Start Time 0802   PT Stop Time 0900   PT Time Calculation (min) 58 min   Activity Tolerance Patient tolerated treatment well      Past Medical History  Diagnosis Date  . Migraine age 60's    with aura  . History of abnormal Pap smear 1991  . Shingles outbreak 06/2008    abdomen    Past Surgical History  Procedure Laterality Date  . Cervix lesion destruction    . Tonsillectomy  age 85  . Breast surgery Left 1997    lumpectomy benign  . Cervical polypectomy  1999  . Colonoscopy w/ biopsies  09/22/10    1 polyp benign recheck in 10 years.    There were no vitals filed for this visit.  Visit Diagnosis:  Shoulder pain, right - Plan: PT plan of care cert/re-cert  Stiffness of joint, shoulder region, right - Plan: PT plan of care cert/re-cert  Abnormal posture - Plan: PT plan of care cert/re-cert  Decreased functional mobility and endurance - Plan: PT plan of care cert/re-cert      Subjective Assessment - 03/27/15 0800    Subjective Patient reports that she has had a frozen shoulder ~15 years ago which resolved. She has noticed tightness in the Rt shoulder with reaching back and up and is now awakening in the middle of the night with Rt shd pain. She noticed pain in 12/16 and received injection with minimal improvement.  She is working out with a trainer 2-3 times/wk for 30-40 min doing cardio and wt training and sees a massage 1/2 times/wk for 20 min. Symptoms continue.    Pertinent History Frozen shoulder ~15 -20 yrs ago  which resolved with PT and HEP; Lt achilles tendonitis; migraines   Diagnostic tests xrays (-)   Patient Stated Goals get rid of the shoulder pain and regain ROM    Currently in Pain? Yes   Pain Score 5    Pain Location Shoulder   Pain Orientation Right   Pain Descriptors / Indicators Aching;Nagging;Sharp;Burning   Pain Radiating Towards into the Rt arm    Pain Onset More than a month ago   Pain Frequency Intermittent   Aggravating Factors  movement; reaching; lying on the Rt of Lt side   Pain Relieving Factors holding arm still; not using arm functionally; heat; meds            [facility] PT Assessment - 03/27/15 0001    Assessment   Medical Diagnosis Rt shoulder pain    Referring Provider Dr. Beverely Low   Onset Date/Surgical Date 09/27/15   Hand Dominance Right   Next MD Visit no return scheduled - PRN   Prior Therapy none for current episode   Precautions   Precautions None   Balance Screen   Has the patient fallen in the past 6 months No   Has the patient had a decrease in activity level because of a fear of falling?  No   Is the patient reluctant  to leave their home because of a fear of falling?  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    Leisure gym 5 days/wk; shopping; housshold chores   Observation/Other Assessments   Focus on Therapeutic Outcomes (FOTO)  40% limitation    Sensation   Additional Comments WFL's per pt report    Posture/Postural Control   Posture Comments head forward; shoulders rounded and elevated; head of the humerus anterior in orientation; scapulae abducted and rotated along the thoracic wall; increased thoracic kyphosis   AROM   Overall AROM Comments Functional IR Rt lateral hip; Lt T&   Right/Left Shoulder --  AROM assessed in standing IR/ER at 90 deg abd    Right Shoulder Extension 47  Degrees   Right Shoulder Flexion 132 Degrees   Right Shoulder ABduction 140 Degrees   Right Shoulder Internal Rotation 10 Degrees   Right Shoulder External Rotation 80 Degrees   Left Shoulder Extension 54 Degrees   Left Shoulder Flexion 160 Degrees   Left Shoulder ABduction 163 Degrees   Left Shoulder Internal Rotation 23 Degrees   Left Shoulder External Rotation 102 Degrees   Strength   Overall Strength Comments 5/5 bilat UE's    Palpation   Palpation comment muscular tightness ant/lat/post cervical; upper trap; leveator; teres; pecs Rt >>Lt                    [facility] Adult PT Treatment/Exercise - 03/27/15 0001    Therapeutic Activites    Therapeutic Activities --  myofacial ball release work    Neuro Re-ed    Neuro Re-ed Details  working on posture and alignment - engaging posterior shoulder girdle    Shoulder Exercises: Standing   Retraction --  scap squeeze with swim noodle 10 sec x 10    Other Standing Exercises axial extension 10 sec x5    Shoulder Exercises: Lawyer --  doorway stretch 3 positions 30 sec hold x3    Other Shoulder Stretches biceps stretch 30 sec x3    Cryotherapy   Number Minutes Cryotherapy 15 Minutes   Cryotherapy Location Shoulder  Rt   Type of Cryotherapy Ice pack   Electrical Stimulation   Electrical Stimulation Location Rt shoulder    Electrical Stimulation Action IFC   Electrical Stimulation Parameters to tolerance    Electrical Stimulation Goals Pain;Tone                PT Education - 03/27/15 0830    Education provided Yes   Education Details posture and alignment; HEP; TENS info   Person(s) Educated Patient   Methods Explanation;Demonstration;Tactile cues;Verbal cues;Handout   Comprehension Returned demonstration;Verbalized understanding;Verbal cues required;Tactile cues required             PT Long Term Goals - 03/27/15 0858    PT LONG TERM GOAL #1   Title Improve posture and alignment with  engagement of posterior shoulder girdlle musculature to improve mechanical position of GH joint 05/08/15   Time 6   Period Weeks   Status New   PT LONG TERM GOAL #2   Title Increased Rt shd ROM to equal of greater than Lt shd ROM 05/08/15   Time 6   Period Weeks   Status New   PT LONG TERM GOAL #3   Title  Decrease pain allowing pt to use arm functionally and sleep without awakening due to pain 05/08/15   Time 6   Period Weeks   Status New   PT LONG TERM GOAL #4   Title I in HEP 05/08/15   Time 6   Period Weeks   Status New   PT LONG TERM GOAL #5   Title Improve FOTO to </= 32% limitation 05/08/15   Time 6   Period Weeks   Status New               Plan - 03/27/15 1610    Clinical Impression Statement Patient presents with signs and symptoms consistent 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 She has limited ROM; abnormal posture and alignment; muscular tightness to palpatiion; pain with functional activities   Pt will benefit from skilled therapeutic intervention in order to improve on the following deficits Postural dysfunction;Improper body mechanics;Decreased range of motion;Decreased mobility;Decreased endurance;Decreased activity tolerance;Increased fascial restricitons;Pain   Rehab Potential Good   PT Frequency 2x / week   PT Duration 6 weeks   PT Treatment/Interventions Patient/family education;ADLs/Self Care Home Management;Neuromuscular re-education;Manual techniques;Dry needling;Cryotherapy;Electrical Stimulation;Ultrasound;Iontophoresis /ml Dexamethasone   PT Next Visit Plan neuromuscular re-ed for posterior shd girdle engaging middle and lower traps; stretching Rt shd; posterior shd girdle strengthening; shd mobs; manual work through [employer]; modalities as indicated   PT Home Exercise Plan myofacial ball release; postural correctioin; HEP; info for TENS    Consulted and Agree with Plan of Care Patient         Problem  List Patient Active Problem List   Diagnosis Date Noted  . Osteoporosis 10/25/2014    Ivee Poellnitz Rober Minion PT, MPH  03/27/2015, 9:11 AM  [facility] [address]  [address] [place], [place], [address] Phone: [phone]   Fax:  [phone]  Name: Kayla Lewis MRN: [id number] Date of Birth: [birth year]

## 2015-04-02 ENCOUNTER — Ambulatory Visit: Payer: Managed Care, Other (non HMO) | Admitting: Physical Therapy

## 2015-04-02 DIAGNOSIS — M25511 Pain in right shoulder: Secondary | ICD-10-CM | POA: Diagnosis not present

## 2015-04-02 DIAGNOSIS — M25611 Stiffness of right shoulder, not elsewhere classified: Secondary | ICD-10-CM

## 2015-04-02 DIAGNOSIS — R293 Abnormal posture: Secondary | ICD-10-CM

## 2015-04-02 DIAGNOSIS — Z7409 Other reduced mobility: Secondary | ICD-10-CM

## 2015-04-02 NOTE — Therapy (Signed)
[facility] [address]  [place], [place], [address] Phone: [phone]   Fax:  [phone]  Physical Therapy Treatment  Patient Details  Name: Kayla Lewis MRN: [id number] Date of Birth: 09/20/1960 Referring Provider: Dr. Netta Cedars  Encounter Date: 04/02/2015      PT End of Session - 04/02/15 0757    Visit Number 2   Number of Visits 12   Date for PT Re-Evaluation 05/08/15   PT Start Time 5465   PT Stop Time 0854   PT Time Calculation (min) 57 min   Activity Tolerance Patient limited by pain      Past Medical History  Diagnosis Date  . Migraine age 55    with aura  . History of abnormal Pap smear 1991  . Shingles outbreak 06/2008    abdomen    Past Surgical History  Procedure Laterality Date  . Cervix lesion destruction    . Tonsillectomy  age 55  . Breast surgery Left 1997    lumpectomy benign  . Cervical polypectomy  1999  . Colonoscopy w/ biopsies  09/22/10    1 polyp benign recheck in 10 years.    There were no vitals filed for this visit.  Visit Diagnosis:  Shoulder pain, right  Stiffness of joint, shoulder region, right  Abnormal posture  Decreased functional mobility and endurance      Subjective Assessment - 04/02/15 0757    Subjective Pt reports she hasn't done any of the HEP since her last visit, she has been working on sitting up straighter at her desk though.  Saw her trainer this AM and is working with them on light kettle bell work and abdominals    Currently in Pain? Yes   Pain Score 4    Pain Location Shoulder   Pain Orientation Right   Pain Descriptors / Indicators Dull   Pain Type Acute pain   Pain Onset More than a month ago   Pain Frequency Intermittent   Aggravating Factors  mainly reaching across her body.    Pain Relieving Factors heat and meds                         [facility] Adult PT Treatment/Exercise - 04/02/15 0001    Self-Care   Self-Care Heat/Ice Application  discussed using ice on her shoulder to help decrease inflamm   Shoulder Exercises: Supine   Horizontal ABduction Strengthening;Both;20 reps;Theraband   Theraband Level (Shoulder Horizontal ABduction) Level 2 (Red)   External Rotation Strengthening;Both;20 reps;Theraband   Theraband Level (Shoulder External Rotation) Level 2 (Red)   Flexion Strengthening;Both;20 reps;Theraband  overhead pull    Theraband Level (Shoulder Flexion) Level 2 (Red)   Other Supine Exercises 5x5sec thoracic lifts   Other Supine Exercises 2x10 D1 flexion, red band.    Shoulder Exercises: ROM/Strengthening   UBE (Upper Arm Bike) L1x4" alt FWD/BWD   Shoulder Exercises: Isometric Strengthening   External Rotation 5X10"  Rt   Internal Rotation 5X10"  Rt   Shoulder Exercises: Stretch   Other Shoulder Stretches doorway stretches mid and low x 30sec twice.    Other Shoulder Stretches on half bolster, chest stetch, unable to tolerate whole bolster d/t shoulder pain Rt    Modalities   Modalities Electrical Stimulation;Cryotherapy   Cryotherapy   Number Minutes Cryotherapy 15 Minutes   Cryotherapy Location Shoulder  Rt   Type of Cryotherapy Ice pack   Electrical Stimulation  Electrical Stimulation Location Rt shoulder    Electrical Stimulation Action IFC   Electrical Stimulation Parameters to tolerance   Electrical Stimulation Goals Pain;Tone   Manual Therapy   Manual Therapy Joint mobilization;Soft tissue mobilization   Joint Mobilization Rt shoulder posterior & inferior mobs grade III, 5 bouts    Soft tissue mobilization Rt pec, teres minor & major TPR and STW                     PT Long Term Goals - 04/02/15 0807    PT LONG TERM GOAL #1   Title Improve posture and alignment with engagement of posterior shoulder girdlle musculature to improve mechanical position of [place] joint 05/08/15   Time 6   Period Weeks   Status On-going   PT LONG TERM GOAL #2   Title  Increased Rt shd ROM to equal of greater than Lt shd ROM 05/08/15   Time 6   Period Weeks   Status On-going   PT LONG TERM GOAL #3   Title Decrease pain allowing pt to use arm functionally and sleep without awakening due to pain 05/08/15   Time 6   Period Weeks   Status On-going   PT LONG TERM GOAL #4   Title I in HEP 05/08/15   Time 6   Period Weeks   Status On-going   PT LONG TERM GOAL #5   Title Improve FOTO to </= 32% limitation 05/08/15   Time 6   Period Weeks   Status On-going               Plan - 04/02/15 0808    Clinical Impression Statement This is Lindas second visit with [place], no goals met.  She hasn't been performing her stretches however continues to work out with her training, not performing overhead activities.  Tight pecs bilat are pulling her humerus forward  causing impingement.    Pt will benefit from skilled therapeutic intervention in order to improve on the following deficits Postural dysfunction;Improper body mechanics;Decreased range of motion;Decreased mobility;Decreased endurance;Decreased activity tolerance;Increased fascial restricitons;Pain   Rehab Potential Good   PT Frequency 2x / week   PT Duration 6 weeks   PT Treatment/Interventions Patient/family education;ADLs/Self Care Home Management;Neuromuscular re-education;Manual techniques;Dry needling;Cryotherapy;Electrical Stimulation;Ultrasound;Iontophoresis '4mg'$ /ml Dexamethasone   PT Next Visit Plan neuromuscular re-ed for posterior shd girdle engaging middle and lower traps; stretching Rt shd; posterior shd girdle strengthening; shd mobs; manual work through [employer]; modalities as indicated   Oncologist with Plan of Care Patient        Problem List Patient Active Problem List   Diagnosis Date Noted  . Osteoporosis 10/25/2014    Jeral Pinch PT 04/02/2015, 8:42 AM  [facility] [address]  [place], [place],  [address] Phone: [phone]   Fax:  [phone]  Name: TEILA SKALSKY MRN: [id number] Date of Birth: [birth year]

## 2015-04-04 ENCOUNTER — Encounter: Payer: Self-pay | Admitting: Rehabilitative and Restorative Service Providers"

## 2015-04-04 ENCOUNTER — Ambulatory Visit: Payer: Managed Care, Other (non HMO) | Admitting: Rehabilitative and Restorative Service Providers"

## 2015-04-04 DIAGNOSIS — R293 Abnormal posture: Secondary | ICD-10-CM

## 2015-04-04 DIAGNOSIS — M25611 Stiffness of right shoulder, not elsewhere classified: Secondary | ICD-10-CM

## 2015-04-04 DIAGNOSIS — Z7409 Other reduced mobility: Secondary | ICD-10-CM

## 2015-04-04 DIAGNOSIS — M25511 Pain in right shoulder: Secondary | ICD-10-CM

## 2015-04-04 NOTE — Therapy (Signed)
[facility] [address]  [address] [place], [place], [address] Phone: [phone]   Fax:  [phone]  Physical Therapy Treatment  Patient Details  Name: Kayla Lewis MRN: [id number] Date of Birth: 1960/09/22 Referring Provider: Dr. Malon Kindle  Encounter Date: 04/04/2015      PT End of Session - 04/04/15 0913    Visit Number 3   Number of Visits 12   Date for PT Re-Evaluation 05/08/15   PT Start Time 0805   PT Stop Time 0900   PT Time Calculation (min) 55 min   Activity Tolerance Patient tolerated treatment well      Past Medical History  Diagnosis Date  . Migraine age 55's    with aura  . History of abnormal Pap smear 1991  . Shingles outbreak 06/2008    abdomen    Past Surgical History  Procedure Laterality Date  . Cervix lesion destruction    . Tonsillectomy  age 55  . Breast surgery Left 1997    lumpectomy benign  . Cervical polypectomy  1999  . Colonoscopy w/ biopsies  09/22/10    1 polyp benign recheck in 10 years.    There were no vitals filed for this visit.  Visit Diagnosis:  Shoulder pain, right  Stiffness of joint, shoulder region, right  Abnormal posture  Decreased functional mobility and endurance      Subjective Assessment - 04/04/15 0817    Subjective Patient reports that she continues to have 'sensitivity" through the Rt shoulder. She is still working out with her trainer. Wants to know if she can box with trainer.    Pertinent History Frozen shoulder ~15 -20 yrs ago which resolved with PT and HEP; Lt achilles tendonitis; migraines   Currently in Pain? Yes   Pain Score 4    Pain Orientation Right   Pain Descriptors / Indicators Dull   Pain Type Acute pain   Pain Onset More than a month ago   Pain Frequency Intermittent            [facility] PT Assessment - 04/04/15 0001    Assessment   Medical Diagnosis Rt shoulder pain    Referring Provider Dr. Malon Kindle   Onset  Date/Surgical Date 09/27/14   Hand Dominance Right   Next MD Visit no return scheduled - PRN   Prior Therapy none for current episode   PROM   Overall PROM Comments PROM Rt shd flexion 140 deg; ER at 90 deg abd/scaption 85 deg    Palpation   Palpation comment muscular tightness ant/lat/post cervical; upper trap; leveator; teres; pecs Rt >>Lt    Special Tests    Special Tests --  (+) neural tension test bilat UE's                      [facility] Adult PT Treatment/Exercise - 04/04/15 0001    Shoulder Exercises: Standing   Extension Both;20 reps;Theraband   Theraband Level (Shoulder Extension) Level 3 (Green)   Row [employer];Theraband   Theraband Level (Shoulder Row) Level 3 (Green)   Retraction Both;20 reps;Theraband  with swim noodle    Theraband Level (Shoulder Retraction) Level 2 (Red)   Other Standing Exercises wall slide shoulder flexion 20 sec x 5    Shoulder Exercises: Stretch   Corner Stretch --  doorway 3 positions x 30 sec hold x 3    Other Shoulder Stretches Neural mobilization median nerve supine 1 min  x 2 each UE    Other Shoulder Stretches supine on swim noodle arms at ~80 deg ~2-3 min stretch    Manual Therapy   Manual Therapy Joint mobilization;Soft tissue mobilization   Manual therapy comments pt supine    Joint Mobilization Rt shoulder posterior & inferior mobs grade III, 5 bouts    Soft tissue mobilization Rt pec, teres minor & major TPR and STW   Passive ROM PROM flexion and ER Rt shd                 PT Education - 04/04/15 0841    Education provided Yes   Education Details HEP   Person(s) Educated Patient   Methods Explanation;Demonstration;Tactile cues;Verbal cues;Handout   Comprehension Verbalized understanding;Returned demonstration;Verbal cues required;Tactile cues required             PT Long Term Goals - 04/02/15 0807    PT LONG TERM GOAL #1   Title Improve posture and alignment with engagement of posterior shoulder  girdlle musculature to improve mechanical position of GH joint 05/08/15   Time 6   Period Weeks   Status On-going   PT LONG TERM GOAL #2   Title Increased Rt shd ROM to equal of greater than Lt shd ROM 05/08/15   Time 6   Period Weeks   Status On-going   PT LONG TERM GOAL #3   Title Decrease pain allowing pt to use arm functionally and sleep without awakening due to pain 05/08/15   Time 6   Period Weeks   Status On-going   PT LONG TERM GOAL #4   Title I in HEP 05/08/15   Time 6   Period Weeks   Status On-going   PT LONG TERM GOAL #5   Title Improve FOTO to </= 32% limitation 05/08/15   Time 6   Period Weeks   Status On-going               Plan - 04/04/15 0913    Clinical Impression Statement Patient reports that she has not had a lot of time and has difficulty with a place for pec stretches. Modified stretch instructing pt in unilateral stretch to encourage her to do this stretch at work. Reinforced the importance of pec stretch and posterior shoulder girdle strengthening. Encouraged pt to work on this with trainer.    Pt will benefit from skilled therapeutic intervention in order to improve on the following deficits Postural dysfunction;Improper body mechanics;Decreased range of motion;Decreased mobility;Decreased endurance;Decreased activity tolerance;Increased fascial restricitons;Pain   Rehab Potential Good   PT Frequency 2x / week   PT Duration 6 weeks   PT Treatment/Interventions Patient/family education;ADLs/Self Care Home Management;Neuromuscular re-education;Manual techniques;Dry needling;Cryotherapy;Electrical Stimulation;Ultrasound;Iontophoresis 4mg /ml Dexamethasone   PT Next Visit Plan neuromuscular re-ed for posterior shd girdle engaging middle and lower traps; stretching Rt shd; posterior shd girdle strengthening; shd mobs; manual work through [employer]/trap/teres; modalities as indicated   PT Home Exercise Plan myofacial ball release; postural correctioin; HEP; info for  TENS    Consulted and Agree with Plan of Care Patient        Problem List Patient Active Problem List   Diagnosis Date Noted  . Osteoporosis 10/25/2014    [name] PT, MPH  04/04/2015, 9:16 AM  [facility] [address]  [address] [place], [place], [address] Phone: [phone]   Fax:  [phone]  Name: Kayla Lewis MRN: [id number] Date of Birth: 1960-10-13

## 2015-04-04 NOTE — Patient Instructions (Signed)
Resisted External Rotation: in Neutral - Bilateral   PALMS UP Sit or stand, tubing in both hands, elbows at sides, bent to 90, forearms forward. Pinch shoulder blades together and rotate forearms out. Keep elbows at sides. Repeat __10__ times per set. Do _2-3___ sets per session. Do _2-3___ sessions per day.   Low Row: Standing   Face anchor, feet shoulder width apart. Palms up, pull arms back, squeezing shoulder blades together. Repeat 10__ times per set. Do 2-3__ sets per session. Do 2-3__ sessions per week. Anchor Height: Waist   Scapular Retraction: Rowing (Eccentric) - Arms - 45 Degrees (Resistance Band)    Hold end of band in each hand. Pull back until elbows are even with trunk. Keep elbows out from sides at 45, thumbs up. Slowly release for 3-5 seconds. Use _green _ resistance band.  10___ reps per set, _1-3__ sets  1-2 times per day   Strengthening: Resisted Extension   Hold tubing in right hand, arm forward. Pull arm back, elbow straight. Repeat _10___ times per set. Do 2-3____ sets per session. Do 2-3____ sessions per day.    Target middle trap and lower trap AND stretch pecs!!!  Avoid chest press and overhead press

## 2015-04-11 ENCOUNTER — Encounter: Payer: Self-pay | Admitting: Rehabilitative and Restorative Service Providers"

## 2015-04-11 ENCOUNTER — Ambulatory Visit: Payer: Managed Care, Other (non HMO) | Admitting: Rehabilitative and Restorative Service Providers"

## 2015-04-11 DIAGNOSIS — M25511 Pain in right shoulder: Secondary | ICD-10-CM

## 2015-04-11 DIAGNOSIS — Z7409 Other reduced mobility: Secondary | ICD-10-CM

## 2015-04-11 DIAGNOSIS — R293 Abnormal posture: Secondary | ICD-10-CM

## 2015-04-11 DIAGNOSIS — M25611 Stiffness of right shoulder, not elsewhere classified: Secondary | ICD-10-CM

## 2015-04-11 NOTE — Therapy (Signed)
[facility] Out[facility] [address]  [address] [place], [place], [address] Phone: [phone]   Fax:  [phone]  Physical Therapy Treatment  Patient Details  Name: Kayla Lewis MRN: [id number] Date of Birth: 1960-03-05 Referring Provider: Dr. Malon Kindle  Encounter Date: 04/11/2015      PT End of Session - 04/11/15 0802    Visit Number 4   Number of Visits 12   Date for PT Re-Evaluation 05/08/15   PT Start Time 0800   PT Stop Time 0846   PT Time Calculation (min) 46 min   Activity Tolerance Patient tolerated treatment well      Past Medical History  Diagnosis Date  . Migraine age 56's    with aura  . History of abnormal Pap smear 1991  . Shingles outbreak 06/2008    abdomen    Past Surgical History  Procedure Laterality Date  . Cervix lesion destruction    . Tonsillectomy  age 57  . Breast surgery Left 1997    lumpectomy benign  . Cervical polypectomy  1999  . Colonoscopy w/ biopsies  09/22/10    1 polyp benign recheck in 10 years.    There were no vitals filed for this visit.  Visit Diagnosis:  Shoulder pain, right  Stiffness of joint, shoulder region, right  Abnormal posture  Decreased functional mobility and endurance      Subjective Assessment - 04/11/15 0806    Subjective Patient reports that she is improving. Feeels some "pressure" with some activities. When she rolls onto the Rt shoulder at night it will wake her up. She is sleeping better in general. Using arm for all functional activities. Still has difficulty reaching behind her in shower or for items.   Pain Score 3    Pain Location Shoulder   Pain Orientation Right   Pain Descriptors / Indicators Dull   Pain Type Acute pain   Pain Onset More than a month ago   Pain Frequency Intermittent   Aggravating Factors  difficult to reach behind her    Pain Relieving Factors heat and meds            [facility] PT Assessment - 04/11/15 0001    Assessment   Medical Diagnosis Rt shoulder pain    Referring Provider Dr. Malon Kindle   Onset Date/Surgical Date 09/27/14   Hand Dominance Right   Next MD Visit no return scheduled - PRN   Prior Therapy none for current episode   AROM   Right Shoulder Extension 70 Degrees   Right Shoulder Flexion 137 Degrees   Right Shoulder ABduction 145 Degrees   Right Shoulder Internal Rotation 20 Degrees   Right Shoulder External Rotation 90 Degrees   Palpation   Palpation comment muscular tightness ant/lat/post cervical; upper trap; leveator; teres; pecs Rt >>Lt                      [facility] Adult PT Treatment/Exercise - 04/11/15 0001    Shoulder Exercises: Prone   Other Prone Exercises prone series arms at side; behind back; W; superman; airplane x 10 each    Shoulder Exercises: Standing   Extension Both;20 reps;Theraband   Theraband Level (Shoulder Extension) Level 3 (Green)   Row [employer];Theraband   Theraband Level (Shoulder Row) Level 3 (Green)   Retraction Both;20 reps;Theraband  with swim noodle    Theraband Level (Shoulder Retraction) Level 2 (Red)   Shoulder Exercises: ROM/Strengthening  UBE (Upper Arm Bike) L1 6" alt FWD/BWD   Shoulder Exercises: Stretch   Corner Stretch --  doorway 3 positions x 30 sec hold x 3    Internal Rotation Stretch 20 seconds  strap behind back    Manual Therapy   Manual Therapy Joint mobilization;Soft tissue mobilization   Manual therapy comments pt supine    Joint Mobilization Rt shoulder posterior & inferior mobs grade III, 5 bouts    Soft tissue mobilization Rt pec, teres minor & major TPR and STW   Passive ROM PROM flexion and ER Rt shd                 PT Education - 04/11/15 0830    Education provided Yes   Education Details HEP   Person(s) Educated Patient   Methods Explanation;Demonstration;Tactile cues;Verbal cues;Handout   Comprehension Verbalized understanding;Returned demonstration;Verbal cues  required;Tactile cues required             PT Long Term Goals - 04/11/15 0805    PT LONG TERM GOAL #1   Title Improve posture and alignment with engagement of posterior shoulder girdlle musculature to improve mechanical position of GH joint 05/08/15   Time 6   Period Weeks   Status On-going   PT LONG TERM GOAL #2   Title Increased Rt shd ROM to equal of greater than Lt shd ROM 05/08/15   Time 6   Period Weeks   Status On-going   PT LONG TERM GOAL #3   Title Decrease pain allowing pt to use arm functionally and sleep without awakening due to pain 05/08/15   Time 6   Period Weeks   Status On-going   PT LONG TERM GOAL #4   Title I in HEP 05/08/15   Time 6   Period Weeks   Status On-going   PT LONG TERM GOAL #5   Title Improve FOTO to </= 32% limitation 05/08/15   Time 6   Period Weeks   Status On-going               Plan - 04/11/15 0803    Clinical Impression Statement Patient reports some improvement in shoulder - less pain and more mobiility. She is working on exercises at home and continues to work with the trainer at work and that is going well. Hs difficulty fitting the doorway stretching in at work. Progressing well toward stated goals of therapy.    Pt will benefit from skilled therapeutic intervention in order to improve on the following deficits Postural dysfunction;Improper body mechanics;Decreased range of motion;Decreased mobility;Decreased endurance;Decreased activity tolerance;Increased fascial restricitons;Pain   Rehab Potential Good   PT Frequency 2x / week   PT Duration 6 weeks   PT Treatment/Interventions Patient/family education;ADLs/Self Care Home Management;Neuromuscular re-education;Manual techniques;Dry needling;Cryotherapy;Electrical Stimulation;Ultrasound;Iontophoresis 4mg /ml Dexamethasone   PT Next Visit Plan neuromuscular re-ed for posterior shd girdle engaging middle and lower traps; stretching Rt shd; posterior shd girdle strengthening; shd  mobs; manual work through [employer]/trap/teres; modalities as indicated   PT Home Exercise Plan myofacial ball release; postural correctioin; HEP; info for TENS    Consulted and Agree with Plan of Care Patient        Problem List Patient Active Problem List   Diagnosis Date Noted  . Osteoporosis 10/25/2014    Joann Jorge Rober MinionP Essie Lagunes PT, MPH  04/11/2015, 8:44 AM  [facility] [address]  [address] [place], [place], [address] Phone: [phone]   Fax:  [phone]  Name: Kayla Lewis MRN: [id number] Date of Birth: November 01, 1960

## 2015-04-11 NOTE — Patient Instructions (Signed)
Scapular Retraction: Rowing (Eccentric) - Arms - 45 Degrees (Resistance Band)    Hold end of band in each hand. Pull back until elbows are even with trunk. Keep elbows out from sides at 45, thumbs up. Slowly release for 3-5 seconds. Use __green______ resistance band. _10__ reps per set, _1-3__ sets per day   Shoulder Blade Squeeze: Arms at Sides    Arms at sides, parallel, elbows straight, palms up. Press pelvis down. Squeeze backbone with shoulder blades, raising front of shoulders, chest, and arms. Keep head and neck neutral. Hold __3-5_ seconds. Relax. Repeat __10_ times.   Shoulder Blade Squeeze: Fingers Interlaced    Fingers interlaced behind your body, palms facing each other. Press pelvis down. Squeeze backbone with shoulder blades. Raise front of shoulders, chest, and head. Keep neck neutral. Hold _3-5__ seconds. Relax. Repeat _10__ times.   Shoulder Blade Squeeze: W    Arms out to sides at 90 palms down. Bend elbows to 90. Press pelvis down. Squeeze backbone with shoulder blades. Raise arms, front of shoulders, chest, and head. Keep neck neutral. Hold _3-5__ seconds. Relax. Repeat _10__ times.   Shoulder Blade Squeeze: Airplane    Arms out to sides at 90, elbows straight, palms down. Press pelvis down. Squeeze backbone with shoulder blades. Raise arms, front of shoulders, chest, and head. Keep neck neutral. Hold _3-5__ seconds. Relax. Repeat _10__ times.   Shoulder Blade Squeeze: Superperson    Arms alongside head, elbows straight, palms down. Press pelvis down. Squeeze backbone with shoulder blades. Raise arms, chest, and head. Keep neck neutral. Hold _3-5__ seconds. Relax. Repeat _10__ times.   Internal Rotator Cuff Stretch, Standing    Stand holding club behind body, one arm above head, other arm bent behind back. With upper hand, pull gently upward. Hold __10-15_ seconds. Repeat _3-5__ times per session. Do __2_ sessions per day.

## 2015-04-17 ENCOUNTER — Ambulatory Visit: Payer: Managed Care, Other (non HMO)

## 2015-04-17 DIAGNOSIS — M25511 Pain in right shoulder: Secondary | ICD-10-CM | POA: Diagnosis not present

## 2015-04-17 DIAGNOSIS — Z7409 Other reduced mobility: Secondary | ICD-10-CM

## 2015-04-17 DIAGNOSIS — M25611 Stiffness of right shoulder, not elsewhere classified: Secondary | ICD-10-CM

## 2015-04-17 DIAGNOSIS — R293 Abnormal posture: Secondary | ICD-10-CM

## 2015-04-17 NOTE — Therapy (Signed)
[facility] [address]  [address] [place], [place], [address] Phone: [phone]   Fax:  [phone]  Physical Therapy Treatment  Patient Details  Name: Kayla Lewis MRN: [id number] Date of Birth: [birth year] Referring Provider: Dr. Malon Kindle  Encounter Date: 04/17/2015      PT End of Session - 04/17/15 0951    Visit Number 5   Number of Visits 12   Date for PT Re-Evaluation 05/08/15   PT Start Time 0802   PT Stop Time 0846   PT Time Calculation (min) 44 min   Activity Tolerance Patient tolerated treatment well   Behavior During Therapy [facility] for tasks assessed/performed      Past Medical History  Diagnosis Date  . Migraine age 27's    with aura  . History of abnormal Pap smear 1991  . Shingles outbreak 06/2008    abdomen    Past Surgical History  Procedure Laterality Date  . Cervix lesion destruction    . Tonsillectomy  age 54  . Breast surgery Left 1997    lumpectomy benign  . Cervical polypectomy  1999  . Colonoscopy w/ biopsies  09/22/10    1 polyp benign recheck in 10 years.    There were no vitals filed for this visit.  Visit Diagnosis:  Shoulder pain, right  Stiffness of joint, shoulder region, right  Abnormal posture  Decreased functional mobility and endurance      Subjective Assessment - 04/17/15 0808    Subjective Pt. reports no R shoulder pain however reports press   Patient Stated Goals get rid of the shoulder pain and regain ROM    Currently in Pain? No/denies   Multiple Pain Sites No      Today's treatment: Therex: UBE: level 1, 3 min forward, 3 min back  Manual: Grade III R shoulder joint mobs posterior / inferior mobs  PROM R shoulder flexion and ER   Therex: Prone series R shoulder on mat table          I's        T's        Y's Standing shoulder extension with green TB 2 x 20 reps  Standing shoulder row with green TB 2 x 20 reps  Low row x 15# x 15  reps Standing shoulder retraction with red TB x 20 reps  Doorway stretch 3 way 30 sec each R shoulder ER x 15 reps with yellow TB  R shoulder IR x 15 reps with yellow TB R shoulder Internal rotation stretch with strap behind back x 30 sec            PT Long Term Goals - 04/11/15 0805    PT LONG TERM GOAL #1   Title Improve posture and alignment with engagement of posterior shoulder girdlle musculature to improve mechanical position of GH joint 05/08/15   Time 6   Period Weeks   Status On-going   PT LONG TERM GOAL #2   Title Increased Rt shd ROM to equal of greater than Lt shd ROM 05/08/15   Time 6   Period Weeks   Status On-going   PT LONG TERM GOAL #3   Title Decrease pain allowing pt to use arm functionally and sleep without awakening due to pain 05/08/15   Time 6   Period Weeks   Status On-going   PT LONG TERM GOAL #4   Title I in HEP 05/08/15   Time 6  Period Weeks   Status On-going   PT LONG TERM GOAL #5   Title Improve FOTO to </= 32% limitation 05/08/15   Time 6   Period Weeks   Status On-going               Plan - 04/17/15 [id number]    Clinical Impression Statement Pt. tolerated strengthening activity to shoulder girdle well with no pain.  R ER / IR with yellow TB added today with pt. tolerating addition well.  Pt. reports no R shoulder pain only "pressure" with internal rotation.  IR continues to be severly restricted however pt. tolerated IR stretch behind back well.  Pt. currently working with personal trainer at gym multiple times / wk, with STM multiple times weekly.     PT Next Visit Plan neuromuscular re-ed for posterior shd girdle engaging middle and lower traps; stretching Rt shd; posterior shd girdle strengthening; shd mobs; manual work through [employer]/trap/teres; modalities as indicated        Problem List Patient Active Problem List   Diagnosis Date Noted  . Osteoporosis 10/25/2014    Kermit BaloMicah Mareo Portilla, PTA 04/17/2015, 9:59 AM  [facility] [address]  [address] [place], [place], [address] Phone: [phone]   Fax:  [phone]  Name: Kayla Lewis MRN: [id number] Date of Birth: [birth year]

## 2015-05-01 ENCOUNTER — Ambulatory Visit: Payer: Managed Care, Other (non HMO) | Admitting: Physical Therapy

## 2015-05-06 ENCOUNTER — Ambulatory Visit: Payer: Managed Care, Other (non HMO) | Attending: Orthopedic Surgery | Admitting: Physical Therapy

## 2015-05-06 DIAGNOSIS — M25511 Pain in right shoulder: Secondary | ICD-10-CM | POA: Insufficient documentation

## 2015-05-06 DIAGNOSIS — R293 Abnormal posture: Secondary | ICD-10-CM | POA: Insufficient documentation

## 2015-05-06 DIAGNOSIS — M25611 Stiffness of right shoulder, not elsewhere classified: Secondary | ICD-10-CM | POA: Insufficient documentation

## 2015-05-06 NOTE — Therapy (Signed)
[facility] [address]  [place], [place], [address] Phone: [phone]   Fax:  [phone]  Physical Therapy Treatment  Patient Details  Name: Kayla Lewis MRN: [id number] Date of Birth: 08-31-1960 Referring Provider: Dr. Esmond Plants  Encounter Date: 05/06/2015      PT End of Session - 05/06/15 0808    Visit Number 6   Number of Visits 12   Date for PT Re-Evaluation 05/31/15   PT Start Time 0802   PT Stop Time 0846   PT Time Calculation (min) 44 min   Activity Tolerance Patient tolerated treatment well   Behavior During Therapy [facility] for tasks assessed/performed      Past Medical History  Diagnosis Date  . Migraine age 33's    with aura  . History of abnormal Pap smear 1991  . Shingles outbreak 06/2008    abdomen    Past Surgical History  Procedure Laterality Date  . Cervix lesion destruction    . Tonsillectomy  age 86  . Breast surgery Left 1997    lumpectomy benign  . Cervical polypectomy  1999  . Colonoscopy w/ biopsies  09/22/10    1 polyp benign recheck in 10 years.    There were no vitals filed for this visit.      Subjective Assessment - 05/06/15 0806    Subjective Pt denies shoulder pain but continues to note difficulty with reaching behind back into IR.   Currently in Pain? No/denies            [facility] PT Assessment - 05/06/15 0814    Assessment   Medical Diagnosis Rt shoulder pain    Onset Date/Surgical Date 09/27/14   Hand Dominance Right   Next MD Visit no return scheduled - PRN   ROM / Strength   AROM / PROM / Strength AROM;Strength   AROM   AROM Assessment Site Shoulder   Right Shoulder Flexion 136 Degrees   Right Shoulder ABduction 128 Degrees   Right Shoulder Internal Rotation 70 Degrees   Right Shoulder External Rotation 52 Degrees  pain at end range   Left Shoulder Flexion 158 Degrees   Left Shoulder ABduction 161 Degrees   Left Shoulder Internal Rotation 80  Degrees   Left Shoulder External Rotation 78 Degrees   Strength   Strength Assessment Site Shoulder   Right/Left Shoulder Right;Left   Right Shoulder Flexion 4+/5   Right Shoulder ABduction 4+/5   Right Shoulder Internal Rotation 4+/5   Right Shoulder External Rotation 4+/5   Left Shoulder Flexion 5/5   Left Shoulder ABduction 5/5   Left Shoulder Internal Rotation 5/5   Left Shoulder External Rotation 5/5         Today's Treatment  TherEx UBE - level 1.5 fwd/back x 90" each  Manual Grade III R shoulder joint mobs posterior / inferior mobs  PROM R shoulder flexion and ER/IR   TherEx Standing shoulder row with green TB 2 x 20 reps Standing shoulder extension + scapular retraction with green TB 2 x 20 reps  R shoulder ER with red TB x 15  R shoulder IR with red TB x 15 Prone over green (65 cm ) [place]:    I's x10   T's x10   W's x10   Y's x10 BATCA Low row 20# x 15  BATCA Pull down 15# x15          PT Long Term Goals - 05/06/15 0100  PT LONG TERM GOAL #1   Title Improve posture and alignment with engagement of posterior shoulder girdlle musculature to improve mechanical position of [place] joint by 05/31/15   Status Partially Met   PT LONG TERM GOAL #2   Title Increased Rt shd ROM to equal of greater than Lt shd ROM by 05/31/15   Status On-going   PT LONG TERM GOAL #3   Title Decrease pain allowing pt to use arm functionally and sleep without awakening due to pain by 05/31/15   Status Partially Met  No longer having pain while sleeping at night, but still feels weak with overhead reaching and limited with reaching behind her back   PT LONG TERM GOAL #4   Title I in HEP by 05/31/15   Status Partially Met  Met for current HEP   PT LONG TERM GOAL #5   Title Improve FOTO to </= 32% limitation by 05/31/15   Status On-going               Plan - 05/06/15 0848    Clinical Impression Statement Pt reporting less shoulder pain and mostly just stiffness at this point.  ROM remains limited R vs L with pt noting greatest current limitation in overhead  reaching and reaching behind back into IR. Pt reporting limited compliance with HEP secondary to lack of awareness of how to tether theraband, therefore reviewed pt instruction in HEP including ideas to anchor theraband for home use. Goals currently ongoing or paritially met. Pt will benefit from continued therapy to maximize R houlder ROM and functional [place] of R UE.   PT Frequency 2x / week   PT Duration 3 weeks   PT Treatment/Interventions Patient/family education;ADLs/Self Care Home Management;Neuromuscular re-education;Dry needling;Cryotherapy;Electrical Stimulation;Ultrasound;Iontophoresis 32m/ml Dexamethasone;Manual techniques;Passive range of motion;Taping;Therapeutic exercise;Therapeutic activities   PT Next Visit Plan neuromuscular re-ed for posterior shd girdle engaging middle and lower traps; stretching Rt shd; posterior shd girdle strengthening; shd joint mobs; manual work through pecs/trap/teres; modalities as indicated   COncologistwith Plan of Care Patient      Patient will benefit from skilled therapeutic intervention in order to improve the following deficits and impairments:  Postural dysfunction, Improper body mechanics, Decreased range of motion, Decreased mobility, Decreased endurance, Decreased activity tolerance, Increased fascial restricitons, Pain, Decreased strength  Visit Diagnosis: Shoulder pain, right  Stiffness of joint, shoulder region, right  Abnormal posture     Problem List Patient Active Problem List   Diagnosis Date Noted  . Osteoporosis 10/25/2014    JPercival Spanish PT, MPT 05/06/2015, 7:03 PM  [facility] [place] [address]Phone: [phone]  Fax:  [phone] Name: Kayla WUELLNERMRN: [id number] of Birth: 1May 27, 1962

## 2015-05-09 ENCOUNTER — Ambulatory Visit: Payer: Managed Care, Other (non HMO)

## 2015-05-09 DIAGNOSIS — R293 Abnormal posture: Secondary | ICD-10-CM

## 2015-05-09 DIAGNOSIS — M25611 Stiffness of right shoulder, not elsewhere classified: Secondary | ICD-10-CM

## 2015-05-09 DIAGNOSIS — M25511 Pain in right shoulder: Secondary | ICD-10-CM

## 2015-05-09 NOTE — Therapy (Signed)
[facility] Out[facility] [address]  [address] [place], [place], [address] Phone: [phone]   Fax:  [phone]  Physical Therapy Treatment  Patient Details  Name: NYLAH BUTKUS MRN: [id number] Date of Birth: 1960/05/07 Referring Provider: Dr. Malon Kindle  Encounter Date: 05/09/2015      PT End of Session - 05/09/15 0832    Visit Number 7   Number of Visits 12   Date for PT Re-Evaluation 05/31/15   PT Start Time 0803   PT Stop Time 0845   PT Time Calculation (min) 42 min   Activity Tolerance Patient tolerated treatment well   Behavior During Therapy [facility] for tasks assessed/performed      Past Medical History  Diagnosis Date  . Migraine age 17's    with aura  . History of abnormal Pap smear 1991  . Shingles outbreak 06/2008    abdomen    Past Surgical History  Procedure Laterality Date  . Cervix lesion destruction    . Tonsillectomy  age 20  . Breast surgery Left 1997    lumpectomy benign  . Cervical polypectomy  1999  . Colonoscopy w/ biopsies  09/22/10    1 polyp benign recheck in 10 years.    There were no vitals filed for this visit.      Subjective Assessment - 05/09/15 0805    Subjective Pt. reports working out with trainer earlier today in the gym and R shoulder feeling "heavy" afterwards.  Pt. reports R shoulder has been bothering her today however states no "actual pain" in the R shoulder.     Patient Stated Goals get rid of the shoulder pain and regain ROM    Currently in Pain? No/denies   Pain Score 0-No pain   Multiple Pain Sites No      Today's Treatment:  TherEx: UBE - level 1.5 fwd/back x 90" each  Manual: Grade III R shoulder joint mobs posterior / inferior mobs  PROM R shoulder flexion and ER/IR   TherEx: R shoulder circles CW, CCW x 2 lbs x 15 reps each way B supine shoulder flexion with want and 2# cuffweight x 10 reps  Standing shoulder row with blue TB 2 x 20 reps Standing  shoulder extension + scapular retraction with green TB 2 x 20 reps  R shoulder ER with red TB x 15  R shoulder IR with red TB x 15 BATCA Low row 20# x 20  BATCA Pull down 15# x        PT Long Term Goals - 05/06/15 0810    PT LONG TERM GOAL #1   Title Improve posture and alignment with engagement of posterior shoulder girdlle musculature to improve mechanical position of GH joint by 05/31/15   Status Partially Met   PT LONG TERM GOAL #2   Title Increased Rt shd ROM to equal of greater than Lt shd ROM by 05/31/15   Status On-going   PT LONG TERM GOAL #3   Title Decrease pain allowing pt to use arm functionally and sleep without awakening due to pain by 05/31/15   Status Partially Met  No longer having pain while sleeping at night, but still feels weak with overhead reaching and limited with reaching behind her back   PT LONG TERM GOAL #4   Title I in HEP by 05/31/15   Status Partially Met  Met for current HEP   PT LONG TERM GOAL #5   Title Improve FOTO  to </= 32% limitation by 05/31/15   Status On-going               Plan - 05/09/15 0833    Clinical Impression Statement Pt. tolerated all scapular strenghening and R shoulder AROM activities well however continues to report "heaviness" in R shoulder following even mild strengthening activity.  Pt. reports working out with trainer at gym doing some mild shoulder strengthening activity earlier today.     PT Treatment/Interventions Patient/family education;ADLs/Self Care Home Management;Neuromuscular re-education;Dry needling;Cryotherapy;Electrical Stimulation;Ultrasound;Iontophoresis '4mg'$ /ml Dexamethasone;Manual techniques;Passive range of motion;Taping;Therapeutic exercise;Therapeutic activities   PT Next Visit Plan neuromuscular re-ed for posterior shd girdle engaging middle and lower traps; stretching Rt shd; posterior shd girdle strengthening; shd joint mobs; manual work through pecs/trap/teres; modalities as indicated      Patient  will benefit from skilled therapeutic intervention in order to improve the following deficits and impairments:  Postural dysfunction, Improper body mechanics, Decreased range of motion, Decreased mobility, Decreased endurance, Decreased activity tolerance, Increased fascial restricitons, Pain, Decreased strength  Visit Diagnosis: Pain in right shoulder  Stiffness of right shoulder, not elsewhere classified  Abnormal posture     Problem List Patient Active Problem List   Diagnosis Date Noted  . Osteoporosis 10/25/2014    Bess Harvest, PTA 05/09/2015, 1:09 PM  [facility] [address]  [place], [place], [address] Phone: [phone]   Fax:  [phone]  Name: MELODY CIRRINCIONE MRN: [id number] Date of Birth: 11/10/1960

## 2015-05-14 ENCOUNTER — Ambulatory Visit: Payer: Managed Care, Other (non HMO)

## 2015-05-14 DIAGNOSIS — R293 Abnormal posture: Secondary | ICD-10-CM

## 2015-05-14 DIAGNOSIS — M25511 Pain in right shoulder: Secondary | ICD-10-CM | POA: Diagnosis not present

## 2015-05-14 DIAGNOSIS — M25611 Stiffness of right shoulder, not elsewhere classified: Secondary | ICD-10-CM

## 2015-05-14 NOTE — Therapy (Signed)
[facility] [address]  [place], [place], [address] Phone: [phone]   Fax:  [phone]  Physical Therapy Treatment  Patient Details  Name: Kayla Lewis MRN: [id number] Date of Birth: 10-27-1960 Referring Provider: Dr. Esmond Plants  Encounter Date: 05/14/2015      PT End of Session - 05/14/15 1039    Visit Number 8   Number of Visits 12   Date for PT Re-Evaluation 05/31/15   PT Start Time 0802   PT Stop Time 0845   PT Time Calculation (min) 43 min   Activity Tolerance Patient tolerated treatment well   Behavior During Therapy [facility] for tasks assessed/performed      Past Medical History  Diagnosis Date  . Migraine age 67's    with aura  . History of abnormal Pap smear 1991  . Shingles outbreak 06/2008    abdomen    Past Surgical History  Procedure Laterality Date  . Cervix lesion destruction    . Tonsillectomy  age 75  . Breast surgery Left 1997    lumpectomy benign  . Cervical polypectomy  1999  . Colonoscopy w/ biopsies  09/22/10    1 polyp benign recheck in 10 years.    There were no vitals filed for this visit.      Subjective Assessment - 05/14/15 0806    Subjective Pt. reports she did a lot of yardwork Saturday and may be more sore today because of that.     Patient Stated Goals get rid of the shoulder pain and regain ROM    Currently in Pain? Yes   Pain Score 4    Pain Location Shoulder   Pain Orientation Right   Pain Descriptors / Indicators Dull   Pain Type Acute pain   Pain Onset More than a month ago   Pain Frequency Intermittent   Aggravating Factors  difficult to reach behind her    Pain Relieving Factors heat and meds    Multiple Pain Sites No      Today's Treatment:  TherEx: UBE - level 2.0 fwd/back x 90" each  Manual: Grade III R shoulder joint mobs posterior / inferior mobs  PROM R shoulder flexion and ER/IR   TherEx: R shoulder circles CW, CCW x 2 lbs x  15 reps each way B supine shoulder flexion with wand and no weight x 15 reps  Standing shoulder row with blue TB 2 x 15 reps Standing shoulder extension + scapular retraction with green TB 2 x 15 reps  R shoulder ER with red TB x 15 reps R shoulder IR with red TB x 15 reps  R shoulder ER with red TB x 15 reps   BATCA Low row 25# x 20  BATCA Pull down 15# x 15          PT Long Term Goals - 05/06/15 0810    PT LONG TERM GOAL #1   Title Improve posture and alignment with engagement of posterior shoulder girdlle musculature to improve mechanical position of [place] joint by 05/31/15   Status Partially Met   PT LONG TERM GOAL #2   Title Increased Rt shd ROM to equal of greater than Lt shd ROM by 05/31/15   Status On-going   PT LONG TERM GOAL #3   Title Decrease pain allowing pt to use arm functionally and sleep without awakening due to pain by 05/31/15   Status Partially Met  No longer having pain  while sleeping at night, but still feels weak with overhead reaching and limited with reaching behind her back   PT LONG TERM GOAL #4   Title I in HEP by 05/31/15   Status Partially Met  Met for current HEP   PT LONG TERM GOAL #5   Title Improve FOTO to </= 32% limitation by 05/31/15   Status On-going               Plan - 05/14/15 1039    Clinical Impression Statement Pt. tolerated all scapular strengthening and R shoulder AROM activities well today however with increased R shoulder pain initially today.  Pt. continues to report working out with trainer at gym doing mild plyometrics with physioball tosses; pt. instructed to avoid "quick jerky movements" at the R shoulder for now.     PT Treatment/Interventions Patient/family education;ADLs/Self Care Home Management;Neuromuscular re-education;Dry needling;Cryotherapy;Electrical Stimulation;Ultrasound;Iontophoresis '4mg'$ /ml Dexamethasone;Manual techniques;Passive range of motion;Taping;Therapeutic exercise;Therapeutic activities   PT Next Visit  Plan neuromuscular re-ed for posterior shd girdle engaging middle and lower traps; stretching Rt shd; posterior shd girdle strengthening; shd joint mobs; manual work through pecs/trap/teres; modalities as indicated      Patient will benefit from skilled therapeutic intervention in order to improve the following deficits and impairments:  Postural dysfunction, Improper body mechanics, Decreased range of motion, Decreased mobility, Decreased endurance, Decreased activity tolerance, Increased fascial restricitons, Pain, Decreased strength  Visit Diagnosis: Pain in right shoulder  Stiffness of right shoulder, not elsewhere classified  Abnormal posture     Problem List Patient Active Problem List   Diagnosis Date Noted  . Osteoporosis 10/25/2014    Bess Harvest, PTA 05/14/2015, 11:20 AM  [facility] [address]  [place], [place], [address] Phone: [phone]   Fax:  [phone]  Name: Kayla Lewis MRN: [id number] Date of Birth: [birth year]

## 2015-05-17 ENCOUNTER — Ambulatory Visit: Payer: Managed Care, Other (non HMO) | Admitting: Physical Therapy

## 2015-05-17 DIAGNOSIS — M25611 Stiffness of right shoulder, not elsewhere classified: Secondary | ICD-10-CM

## 2015-05-17 DIAGNOSIS — M25511 Pain in right shoulder: Secondary | ICD-10-CM | POA: Diagnosis not present

## 2015-05-17 DIAGNOSIS — R293 Abnormal posture: Secondary | ICD-10-CM

## 2015-05-17 NOTE — Therapy (Addendum)
[facility] [address]  [place], [place], [address] Phone: [phone]   Fax:  [phone]  Physical Therapy Treatment  Patient Details  Name: Kayla Lewis MRN: [id number] Date of Birth: 1960/08/15 Referring Provider: Dr. Esmond Plants  Encounter Date: 05/17/2015      PT End of Session - 05/17/15 0805    Visit Number 9   Number of Visits 12   Date for PT Re-Evaluation 05/31/15   PT Start Time 0801   PT Stop Time 0841   PT Time Calculation (min) 40 min   Activity Tolerance Patient tolerated treatment well   Behavior During Therapy [facility] for tasks assessed/performed      Past Medical History  Diagnosis Date  . Migraine age 26's    with aura  . History of abnormal Pap smear 1991  . Shingles outbreak 06/2008    abdomen    Past Surgical History  Procedure Laterality Date  . Cervix lesion destruction    . Tonsillectomy  age 53  . Breast surgery Left 1997    lumpectomy benign  . Cervical polypectomy  1999  . Colonoscopy w/ biopsies  09/22/10    1 polyp benign recheck in 10 years.    There were no vitals filed for this visit.      Subjective Assessment - 05/17/15 0804    Subjective Pt stating her shoulder is feeling better today.   Currently in Pain? No/denies            [facility] PT Assessment - 05/17/15 0801    AROM   AROM Assessment Site Shoulder   Right/Left Shoulder Right   Right Shoulder Flexion 141 Degrees   Right Shoulder ABduction 139 Degrees   Right Shoulder Internal Rotation 66 Degrees   Right Shoulder External Rotation 72 Degrees           Today's Treatment  TherEx UBE - level 2.0 fwd/back x 90" each BATCA Pull down 15# x 15  Manual Grade III R shoulder joint mobs posterior / inferior mobs  PROM R shoulder flexion and ER/IR  STM to pecs  TherEx Hooklying on 1/2 foam roll:   Chest/Pec stretch x2'   B Shoulder flexion pullover with 4# 15x3"   B [place] Horiz ABD  with green TB 15x3"   R Shoulder Serratus punch 2# x15   R Shoulder Circles CW/CCW 2# x15 each Doorway chest stretch - low/mid/high x20" each Prone over green (65 cm ) Pball:  I's 1# x10  T's 1# x10  W's 1# x10  Y's 1# x10 BATCA Low row 25# x 20 Shoulder flexion orange (55 cm) Pball roll-up with stretch at end range x10          PT Long Term Goals - 05/17/15 2706    PT LONG TERM GOAL #1   Title Improve posture and alignment with engagement of posterior shoulder girdlle musculature to improve mechanical position of [place] joint by 05/31/15   Status Achieved   PT LONG TERM GOAL #2   Title Increased Rt shd ROM to equal of greater than Lt shd ROM by 05/31/15   Status On-going   PT LONG TERM GOAL #3   Title Decrease pain allowing pt to use arm functionally and sleep without awakening due to pain by 05/31/15   Status Partially Met  No longer having pain while sleeping at night, but still feels some limitation with reaching overhead although better than before   PT  LONG TERM GOAL #4   Title I in HEP by 05/31/15   Status Partially Met  Met for current HEP   PT LONG TERM GOAL #5   Title Improve FOTO to </= 32% limitation by 05/31/15   Status On-going               Plan - 05/17/15 0843    Clinical Impression Statement Pt without pain today but continues to demonstrate anterior shoulder tightness therefore added various pec/chest stretches into POC today. ROM continues to improve but still somewhat functionally limited in flexion and IR. Pt nearing the end of her initially approved visits (9 of 12), therefore will plan to review and update HEP over next few visits in preparation for probable D/C but may need to consider recert pending goal status as several goals not yet fully met.   PT Treatment/Interventions Patient/family education;ADLs/Self Care Home Management;Neuromuscular re-education;Dry needling;Cryotherapy;Electrical Stimulation;Ultrasound;Iontophoresis '4mg'$ /ml  Dexamethasone;Manual techniques;Passive range of motion;Taping;Therapeutic exercise;Therapeutic activities   PT Next Visit Plan neuromuscular re-ed for posterior shd girdle engaging middle and lower traps; stretching Rt shd; posterior shd girdle strengthening; shd joint mobs; manual work through pecs/trap/teres; modalities as indicated   Oncologist with Plan of Care Patient      Patient will benefit from skilled therapeutic intervention in order to improve the following deficits and impairments:  Postural dysfunction, Improper body mechanics, Decreased range of motion, Decreased mobility, Decreased endurance, Decreased activity tolerance, Increased fascial restricitons, Pain, Decreased strength  Visit Diagnosis: Pain in right shoulder  Stiffness of right shoulder, not elsewhere classified  Abnormal posture     Problem List Patient Active Problem List   Diagnosis Date Noted  . Osteoporosis 10/25/2014    Percival Spanish, PT, MPT 05/17/2015, 8:52 AM  [facility] [address]  [place], [place], [address] Phone: [phone]   Fax:  [phone]  Name: Kayla Lewis MRN: [id number] Date of Birth: [birth year]   PHYSICAL THERAPY DISCHARGE SUMMARY  Visits from Start of Care: 9  Current functional level related to goals / functional outcomes:   As of last attended PT visits, pt was without pain but continues to demonstrate anterior shoulder tightness. ROM continued to improve but still somewhat functionally limited in flexion and IR. Pt was nearing the end of her initially approved visits (9 of 12), therefore the plan was to review and update HEP over next few visits in preparation for probable D/C but would consider recert pending goal status as several goals not yet fully met.Pt failed to return to PT after this visit due to work schedule conflicts and attempts to contact pt to schedule further visits have been  unsuccessful, therefore will proceed with discharge for failure to return to PT in >30 days.   Remaining deficits:  Unable to formally assess due to failure to return to PT.   Education / Equipment:   HEP  Plan: Patient agrees to discharge.  Patient goals were partially met. Patient is being discharged due to not returning since the last visit.  ?????       Percival Spanish, PT, MPT 07/01/2015, 9:20 AM  [facility] [address]  [place], [place], [address] Phone: [phone]   Fax:  [phone]

## 2015-05-27 ENCOUNTER — Ambulatory Visit: Payer: Managed Care, Other (non HMO) | Admitting: Physical Therapy

## 2015-05-30 ENCOUNTER — Ambulatory Visit: Payer: Managed Care, Other (non HMO)

## 2015-07-09 DIAGNOSIS — R110 Nausea: Secondary | ICD-10-CM | POA: Insufficient documentation

## 2015-07-09 DIAGNOSIS — R1013 Epigastric pain: Secondary | ICD-10-CM | POA: Insufficient documentation

## 2015-07-09 DIAGNOSIS — K2940 Chronic atrophic gastritis without bleeding: Secondary | ICD-10-CM | POA: Insufficient documentation

## 2015-07-09 DIAGNOSIS — R12 Heartburn: Secondary | ICD-10-CM | POA: Insufficient documentation

## 2015-09-11 ENCOUNTER — Telehealth: Payer: Self-pay | Admitting: Nurse Practitioner

## 2015-09-11 NOTE — Telephone Encounter (Signed)
Left patient a message to call back to reschedule a future appointment that was cancelled by the provider. °

## 2015-10-18 ENCOUNTER — Ambulatory Visit: Payer: Commercial Indemnity | Admitting: Nurse Practitioner

## 2015-10-28 ENCOUNTER — Encounter: Payer: Self-pay | Admitting: Nurse Practitioner

## 2015-10-28 ENCOUNTER — Encounter: Payer: Self-pay | Admitting: *Deleted

## 2015-10-28 NOTE — Progress Notes (Deleted)
Patient ID: Kayla Lewis, female   DOB: 01/18/1961, 54 y.o.   MRN: [id number]  54 y.o. G2P2002 Divorced  Caucasian Fe here for annual exam.  Still problems with Errosive esophagitis.  This is better with meds and she is on a plant based diet.  Patient's last menstrual period was 05/27/2007.          Sexually active: No.  The current method of family planning is none.    Exercising: Yes.    walk Smoker:  no  Health Maintenance: Pap: 10/17/14, Negative with neg HR HPV MMG: 01/22/15,, 3D, Bi-Rads 1:  Negative  Colonoscopy: 09/22/10, benign polyp, repeat in 10 years - IFOB is given BMD:  09/22/11, T-Score -2.1 Spine / -2.7 Left Proximal Femur / -2.0 Left Femoral Neck TDaP: 09/25/15 Shingles Vaccine: 03/26/09  Pneumonia: Not indicated due to age Hep C was done 09/25/15 &  HIV: done today Labs: PCP takes care of all labs   Urine: negative   reports that she quit smoking about 17 years ago. Her smoking use included Cigarettes. She has a 1.25 pack-year smoking history. She has never used smokeless tobacco. She reports that she does not drink alcohol or use drugs.  Past Medical History:  Diagnosis Date  . History of abnormal Pap smear 1991  . Migraine age 29's   with aura  . Shingles outbreak 06/2008   abdomen    Past Surgical History:  Procedure Laterality Date  . BREAST SURGERY Left 1997   lumpectomy benign  . CERVICAL POLYPECTOMY  1999  . CERVIX LESION DESTRUCTION    . COLONOSCOPY W/ BIOPSIES  09/22/10   1 polyp benign recheck in 10 years.  . TONSILLECTOMY  age 17    Current Outpatient Prescriptions  Medication Sig Dispense Refill  . betamethasone valerate ointment (VALISONE) 0.1 % Apply a pea sized amount topically BID x 1-2 weeks as needed 15 g 0  . Calcium Carbonate-Vit D-Min (CALTRATE MINIS PLUS MINERALS) 300-800 MG-UNIT TABS Take 2 tablets by mouth daily.    . cetirizine (ZYRTEC) 10 MG tablet Take 10 mg by mouth daily.    . Cholecalciferol (VITAMIN D3) 3000 UNITS TABS Take  3,000 Units by mouth daily.    . Cyanocobalamin (RA VITAMIN B-12 TR) 1000 MCG TBCR Take 1 tablet by mouth daily.    . LOVAZA 1 G capsule Take 1 capsule by mouth daily.    . Magnesium 250 MG TABS Take 1 tablet by mouth daily.    . metroNIDAZOLE (METROGEL) 0.75 % gel Apply topically as needed. roscea    . Multiple Vitamin (MULTIVITAMIN) tablet Take 1 tablet by mouth daily.    . naproxen (NAPROSYN) 500 MG tablet Take 500 mg by mouth 2 (two) times daily with a meal.  1  . niacin 250 MG tablet Take 250 mg by mouth daily with breakfast.    . RESTASIS 0.05 % ophthalmic emulsion INSTILL 1 DROP INTO AFFECTED EYE(S) BY OPHTHALMIC ROUTE EVERY 12 HOURS  1  . rizatriptan (MAXALT) 10 MG tablet Take 10 mg by mouth as needed.    . SUMAtriptan (IMITREX) 100 MG tablet     . topiramate (TOPAMAX) 200 MG tablet Take 1 tablet by mouth daily.     No current facility-administered medications for this visit.     Family History  Problem Relation Age of Onset  . COPD Father   . Depression Sister   . Hypertension Sister   . Obesity Sister   . Hyperlipidemia Sister   .   Heart disease Sister   . Asthma Sister   . Obesity Sister     ROS:  Pertinent items are noted in HPI.  Otherwise, a comprehensive ROS was negative.  Exam:   LMP 05/27/2007    Ht Readings from Last 3 Encounters:  10/17/14 5' 4.25" (1.632 m)  09/28/13 5' 4.25" (1.632 m)  09/23/12 5' 4.25" (1.632 m)    General appearance: alert, cooperative and appears stated age Head: Normocephalic, without obvious abnormality, atraumatic Neck: no adenopathy, supple, symmetrical, trachea midline and thyroid normal to inspection and palpation Lungs: clear to auscultation bilaterally Breasts: normal appearance, no masses or tenderness Heart: regular rate and rhythm Abdomen: soft, non-tender; no masses,  no organomegaly Extremities: extremities normal, atraumatic, no cyanosis or edema Skin: Skin color, texture, turgor normal. No rashes or lesions Lymph  nodes: Cervical, supraclavicular, and axillary nodes normal. No abnormal inguinal nodes palpated Neurologic: Grossly normal   Pelvic: External genitalia:  same lesion on right vulva -bx shows chronic inflamation              Urethra:  normal appearing urethra with no masses, tenderness or lesions              Bartholin's and Skene's: normal                 Vagina: normal appearing vagina with normal color and discharge, no lesions              Cervix: anteverted              Pap taken: No. Bimanual Exam:  Uterus:  normal size, contour, position, consistency, mobility, non-tender              Adnexa: no mass, fullness, tenderness               Rectovaginal: Confirms               Anus:  normal sphincter tone, no lesions  Chaperone present: yes  A:  Well Woman with normal exam  Postmenopausal no HRT Atrophic vaginitis declines vag Estrogen Biopsy of right vulva shows chronic inflammation and slight hyperkeratosis History of Vit D deficiency  History calcifications of the left breast with normal  Mammo 12/15 History of Osteopenia/ osteoporosis of the hip - GI intolerance to Fosamax, and non compliance to nasal spray. - followed by PCP.  To have a repeat done this year.    P:   Reviewed health and wellness pertinent to exam  Pap smear not done  Mammogram is due 12/17  Will follow with labs, IFOB is given  Counseled on breast self exam, mammography screening, adequate intake of calcium and vitamin D, diet and exercise return annually or prn  An After Visit Summary was printed and given to the patient.   

## 2015-10-29 ENCOUNTER — Ambulatory Visit (INDEPENDENT_AMBULATORY_CARE_PROVIDER_SITE_OTHER): Payer: Managed Care, Other (non HMO) | Admitting: Nurse Practitioner

## 2015-10-29 ENCOUNTER — Encounter: Payer: Self-pay | Admitting: Nurse Practitioner

## 2015-10-29 VITALS — BP 110/68 | HR 76 | Resp 18 | Ht 64.0 in | Wt 185.0 lb

## 2015-10-29 DIAGNOSIS — Z01419 Encounter for gynecological examination (general) (routine) without abnormal findings: Secondary | ICD-10-CM

## 2015-10-29 DIAGNOSIS — Z1211 Encounter for screening for malignant neoplasm of colon: Secondary | ICD-10-CM | POA: Diagnosis not present

## 2015-10-29 DIAGNOSIS — Z0000 Encounter for general adult medical examination without abnormal findings: Secondary | ICD-10-CM

## 2015-10-29 LAB — POCT URINALYSIS DIPSTICK
Bilirubin, UA: NEGATIVE
Blood, UA: NEGATIVE
Glucose, UA: NEGATIVE
KETONES UA: NEGATIVE
LEUKOCYTES UA: NEGATIVE
Nitrite, UA: NEGATIVE
PROTEIN UA: NEGATIVE
Urobilinogen, UA: NEGATIVE
pH, UA: 7

## 2015-10-29 MED ORDER — BETAMETHASONE VALERATE 0.1 % EX OINT: TOPICAL_OINTMENT | CUTANEOUS | 6 refills | Status: DC

## 2015-10-29 NOTE — Patient Instructions (Addendum)

## 2015-10-30 LAB — HIV ANTIBODY (ROUTINE TESTING W REFLEX): HIV 1&2 Ab, 4th Generation: NONREACTIVE

## 2015-10-30 NOTE — Progress Notes (Signed)
Patient ID: Kayla Lewis, female   DOB: August 26, 1960, 55 y.o.   MRN: [id number]  55 y.o. G31P2002 Divorced  Caucasian Fe here for annual exam.  Still problems with Errosive esophagitis.  This is better with meds and she is on a plant based diet.  Patient's last menstrual period was 05/27/2007.          Sexually active: No.  The current method of family planning is none.    Exercising: Yes.    walk Smoker:  no  Health Maintenance: Pap: 10/17/14, Negative with neg HR HPV MMG: 01/22/15,, 3D, Bi-Rads 1:  Negative  Colonoscopy: 09/22/10, benign polyp, repeat in 10 years - IFOB is given BMD:  09/22/11, T-Score -2.1 Spine / -2.7 Left Proximal Femur / -2.0 Left Femoral Neck TDaP: 09/25/15 Shingles Vaccine: 03/26/09  Pneumonia: Not indicated due to age Hep C was done 09/25/15 &  HIV: done today Labs: PCP takes care of all labs   Urine: negative   reports that she quit smoking about 17 years ago. Her smoking use included Cigarettes. She has a 1.25 pack-year smoking history. She has never used smokeless tobacco. She reports that she does not drink alcohol or use drugs.  Past Medical History:  Diagnosis Date  . History of abnormal Pap smear 1991  . Migraine age 27's   with aura  . Shingles outbreak 06/2008   abdomen    Past Surgical History:  Procedure Laterality Date  . BREAST SURGERY Left 1997   lumpectomy benign  . CERVICAL POLYPECTOMY  1999  . CERVIX LESION DESTRUCTION    . COLONOSCOPY W/ BIOPSIES  09/22/10   1 polyp benign recheck in 10 years.  . TONSILLECTOMY  age 82    Current Outpatient Prescriptions  Medication Sig Dispense Refill  . betamethasone valerate ointment (VALISONE) 0.1 % Apply a pea sized amount topically BID x 1-2 weeks as needed 15 g 0  . Calcium Carbonate-Vit D-Min (CALTRATE MINIS PLUS MINERALS) 300-800 MG-UNIT TABS Take 2 tablets by mouth daily.    . cetirizine (ZYRTEC) 10 MG tablet Take 10 mg by mouth daily.    . Cholecalciferol (VITAMIN D3) 3000 UNITS TABS Take  3,000 Units by mouth daily.    . Cyanocobalamin (RA VITAMIN B-12 TR) 1000 MCG TBCR Take 1 tablet by mouth daily.    Marland Kitchen LOVAZA 1 G capsule Take 1 capsule by mouth daily.    . Magnesium 250 MG TABS Take 1 tablet by mouth daily.    . metroNIDAZOLE (METROGEL) 0.75 % gel Apply topically as needed. roscea    . Multiple Vitamin (MULTIVITAMIN) tablet Take 1 tablet by mouth daily.    . naproxen (NAPROSYN) 500 MG tablet Take 500 mg by mouth 2 (two) times daily with a meal.  1  . niacin 250 MG tablet Take 250 mg by mouth daily with breakfast.    . RESTASIS 0.05 % ophthalmic emulsion INSTILL 1 DROP INTO AFFECTED EYE(S) BY OPHTHALMIC ROUTE EVERY 12 HOURS  1  . rizatriptan (MAXALT) 10 MG tablet Take 10 mg by mouth as needed.    . SUMAtriptan (IMITREX) 100 MG tablet     . topiramate (TOPAMAX) 200 MG tablet Take 1 tablet by mouth daily.     No current facility-administered medications for this visit.     Family History  Problem Relation Age of Onset  . COPD Father   . Depression Sister   . Hypertension Sister   . Obesity Sister   . Hyperlipidemia Sister   .  Heart disease Sister   . Asthma Sister   . Obesity Sister     ROS:  Pertinent items are noted in HPI.  Otherwise, a comprehensive ROS was negative.  Exam:   LMP 05/27/2007    Ht Readings from Last 3 Encounters:  10/17/14 5' 4.25" (1.632 m)  09/28/13 5' 4.25" (1.632 m)  09/23/12 5' 4.25" (1.632 m)    General appearance: alert, cooperative and appears stated age Head: Normocephalic, without obvious abnormality, atraumatic Neck: no adenopathy, supple, symmetrical, trachea midline and thyroid normal to inspection and palpation Lungs: clear to auscultation bilaterally Breasts: normal appearance, no masses or tenderness Heart: regular rate and rhythm Abdomen: soft, non-tender; no masses,  no organomegaly Extremities: extremities normal, atraumatic, no cyanosis or edema Skin: Skin color, texture, turgor normal. No rashes or lesions Lymph  nodes: Cervical, supraclavicular, and axillary nodes normal. No abnormal inguinal nodes palpated Neurologic: Grossly normal   Pelvic: External genitalia:  same lesion on right vulva -bx shows chronic inflamation              Urethra:  normal appearing urethra with no masses, tenderness or lesions              Bartholin's and Skene's: normal                 Vagina: normal appearing vagina with normal color and discharge, no lesions              Cervix: anteverted              Pap taken: No. Bimanual Exam:  Uterus:  normal size, contour, position, consistency, mobility, non-tender              Adnexa: no mass, fullness, tenderness               Rectovaginal: Confirms               Anus:  normal sphincter tone, no lesions  Chaperone present: yes  A:  Well Woman with normal exam  Postmenopausal no HRT Atrophic vaginitis declines vag Estrogen Biopsy of right vulva shows chronic inflammation and slight hyperkeratosis History of Vit D deficiency  History calcifications of the left breast with normal  Mammo 12/15 History of Osteopenia/ osteoporosis of the hip - GI intolerance to Fosamax, and non compliance to nasal spray. - followed by PCP.  To have a repeat done this year.    P:   Reviewed health and wellness pertinent to exam  Pap smear not done  Mammogram is due 12/17  Will follow with labs, IFOB is given  Counseled on breast self exam, mammography screening, adequate intake of calcium and vitamin D, diet and exercise return annually or prn  An After Visit Summary was printed and given to the patient.

## 2015-11-01 NOTE — Progress Notes (Signed)
Encounter reviewed by Dr. Barnabas Henriques Amundson C. Silva.  

## 2016-01-08 ENCOUNTER — Encounter: Payer: Self-pay | Admitting: *Deleted

## 2016-02-05 ENCOUNTER — Telehealth: Payer: Self-pay | Admitting: *Deleted

## 2016-02-05 NOTE — Telephone Encounter (Signed)
Spoke with patient regarding DEXA ordered at last visit. Patient decided not to have this done. She declined to have it rescheduled -eh

## 2016-03-02 ENCOUNTER — Other Ambulatory Visit: Payer: Self-pay | Admitting: Family Medicine

## 2016-03-02 DIAGNOSIS — Z1231 Encounter for screening mammogram for malignant neoplasm of breast: Secondary | ICD-10-CM

## 2016-03-31 ENCOUNTER — Ambulatory Visit
Admission: RE | Admit: 2016-03-31 | Discharge: 2016-03-31 | Disposition: A | Discharge status: Home / Self Care | Payer: Managed Care, Other (non HMO) | Source: Ambulatory Visit | Attending: Family Medicine | Admitting: Family Medicine

## 2016-03-31 DIAGNOSIS — Z1231 Encounter for screening mammogram for malignant neoplasm of breast: Secondary | ICD-10-CM

## 2016-06-25 DIAGNOSIS — E785 Hyperlipidemia, unspecified: Secondary | ICD-10-CM | POA: Insufficient documentation

## 2016-08-12 ENCOUNTER — Telehealth: Payer: Self-pay | Admitting: Obstetrics and Gynecology

## 2016-08-12 NOTE — Telephone Encounter (Signed)
LMTCB/:NP/ .CX/LETTER SENT/RD ° °

## 2016-10-01 ENCOUNTER — Ambulatory Visit (INDEPENDENT_AMBULATORY_CARE_PROVIDER_SITE_OTHER): Payer: Managed Care, Other (non HMO) | Admitting: Family Medicine

## 2016-10-01 ENCOUNTER — Encounter: Payer: Self-pay | Admitting: Family Medicine

## 2016-10-01 VITALS — BP 120/80 | HR 83 | Ht 65.0 in | Wt 201.4 lb

## 2016-10-01 DIAGNOSIS — G43909 Migraine, unspecified, not intractable, without status migrainosus: Secondary | ICD-10-CM

## 2016-10-01 DIAGNOSIS — M816 Localized osteoporosis [Lequesne]: Secondary | ICD-10-CM

## 2016-10-01 DIAGNOSIS — Z7689 Persons encountering health services in other specified circumstances: Secondary | ICD-10-CM

## 2016-10-01 NOTE — Progress Notes (Signed)
   Subjective:    Patient ID: Kayla Lewis, female    DOB: 1960/03/16, 56 y.o.   MRN: [id number]  HPI Chief Complaint  Patient presents with  . new pt    new pt get established. weight, has osteoporosis and was waiting to get approved for prolia but never got a call to get shot. will get flu shot at work   She is new to the practice and here to establish care.  Previous medical care: Dr. Tresa EndoKelly in [place]. Has been going there for 15-20 years.   [facility] Has an appointment next month.   States she has osteoporosis in her hip and has been approved for Prolia but never started the medication.   States she went through menopause in her late 5740s. States she has never had much calcium in her diet. States she is lactose intolerant.   Works out with a Psychologist, educationaltrainer 2 times per week and yoga once per week.   Migraines- not very often. Triggers are foods and weather. She tries to avoid bread and other foods that set them off. She is taking topiramate.   Lives alone. Has a dog. Divorced. Adult kids. 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   Diet: healthy except she eats a lot of carbs, especially potatoes.   Reviewed allergies, medications, past medical, surgical, family, and social history.   Review of Systems Pertinent positives and negatives in the history of present illness.     Objective:   Physical Exam BP 120/80   Pulse 83   Ht 5\' 5"  (1.651 m)   Wt 201 lb 6.4 oz (91.4 kg)   LMP 05/27/2007   BMI 33.51 kg/m   Alert and oriented and in no acute distress. Not otherwise examined.       Assessment & Plan:  Localized osteoporosis without current pathological fracture  Encounter to establish care  Migraine without status migrainosus, not intractable, unspecified migraine type  Will get her re-approved for Prolia. Discussed management of osteoporosis.  Migraines are pretty well controlled.  Counseled on weight and cutting back on  carbohydrates and sugar.  She will follow up for a fasting CPE soon.

## 2016-10-13 ENCOUNTER — Telehealth: Payer: Self-pay | Admitting: Family Medicine

## 2016-10-13 NOTE — Telephone Encounter (Signed)
Kayla Lewis. Sent request and received summary of benefits 10/02/2016. Pt has private insurance and has not met her deductible. However pt will qualify for copay card from manufacturer. She will need to pay a $25 copay and receive co pay card at appt. Once she receives AOB she will need to follow instructions for card and have the balance taken care up thru them. Pt was called and informed of all information. She is scheduled to receive injection on 11/05/2016 and medication was ordered from [place] today, 10/13/2016. Sending this back to Kayla Lewis as a FYI.

## 2016-10-30 ENCOUNTER — Ambulatory Visit: Payer: Managed Care, Other (non HMO) | Admitting: Nurse Practitioner

## 2016-10-30 ENCOUNTER — Ambulatory Visit (INDEPENDENT_AMBULATORY_CARE_PROVIDER_SITE_OTHER): Payer: Managed Care, Other (non HMO) | Admitting: Certified Nurse Midwife

## 2016-10-30 ENCOUNTER — Encounter: Payer: Self-pay | Admitting: Certified Nurse Midwife

## 2016-10-30 VITALS — BP 110/80 | HR 70 | Resp 16 | Ht 64.0 in | Wt 203.0 lb

## 2016-10-30 DIAGNOSIS — M810 Age-related osteoporosis without current pathological fracture: Secondary | ICD-10-CM | POA: Diagnosis not present

## 2016-10-30 DIAGNOSIS — Z01419 Encounter for gynecological examination (general) (routine) without abnormal findings: Secondary | ICD-10-CM | POA: Diagnosis not present

## 2016-10-30 DIAGNOSIS — Z780 Asymptomatic menopausal state: Secondary | ICD-10-CM

## 2016-10-30 DIAGNOSIS — Z0000 Encounter for general adult medical examination without abnormal findings: Secondary | ICD-10-CM

## 2016-10-30 NOTE — Patient Instructions (Signed)

## 2016-10-30 NOTE — Progress Notes (Signed)
56 y.o. G32P2002 Divorced  Caucasian Fe here for annual exam. Menopausal some hot flashes, night sweats, no sleep issues. Sees [facility] with Hetty Blend NFP and does all labs and will be beginning Osteoporosis treatment with Prolia.Has gained weight over the past 6 months due to not as mobile and had stopped gym work out. Has started gym work out again. Slightly apprehension with starting, but has decided this was a good choice. Patient has not had Vitamin D and CBC checked today, to make sure no anemia or vitamin D deficiency.  Patient's last menstrual period was 05/27/2007.          Sexually active: No.  The current method of family planning is post menopausal status.    Exercising: Yes.    with trainer Smoker:  no  Health Maintenance: Pap:  10-17-14 neg HPV HR neg History of Abnormal Pap: yes MMG:  03-31-16 category c density birads 1:neg Self Breast exams: yes Colonoscopy:  2012 polyp f/u 28yrs BMD:   2018 osteoporosis TDaP:  2017 Shingles: 2012 Pneumonia: not done Hep C and HIV: Hep c done 08/2015, HIV neg 2017 Labs: if needed   reports that she quit smoking about 18 years ago. Her smoking use included Cigarettes. She has a 1.25 pack-year smoking history. She has never used smokeless tobacco. She reports that she does not drink alcohol or use drugs.  Past Medical History:  Diagnosis Date  . Erosive gastritis   . Hiatal hernia   . History of abnormal Pap smear 1991  . Migraine age 10's   with aura  . Osteoporosis   . Shingles outbreak 06/2008   abdomen    Past Surgical History:  Procedure Laterality Date  . BREAST SURGERY Left 1997   lumpectomy benign  . CERVICAL POLYPECTOMY  1999  . CERVIX LESION DESTRUCTION    . COLONOSCOPY W/ BIOPSIES  09/22/10   1 polyp benign recheck in 10 years.  . TONSILLECTOMY  age 53    Current Outpatient Prescriptions  Medication Sig Dispense Refill  . Ca Phosphate-Cholecalciferol (CALCIUM/VITAMIN D3 GUMMIES PO) Take by mouth.  Nature made brand  calcium twice a day/vitamin D3 700 IU daily    . cetirizine (ZYRTEC) 10 MG tablet Take 10 mg by mouth 2 (two) times daily.     . Glucosamine-Chondroitin (OSTEO BI-FLEX REGULAR STRENGTH PO) Take by mouth 2 (two) times daily.    Marland Kitchen LOVAZA 1 G capsule Take 1 capsule by mouth daily.    . ranitidine (ZANTAC) 150 MG tablet Take 150 mg by mouth 2 (two) times daily.    . RESTASIS 0.05 % ophthalmic emulsion INSTILL 1 DROP INTO AFFECTED EYE(S) BY OPHTHALMIC ROUTE EVERY 12 HOURS  1  . SUMAtriptan (IMITREX) 100 MG tablet     . topiramate (TOPAMAX) 200 MG tablet Take 1 tablet by mouth daily.     No current facility-administered medications for this visit.     Family History  Problem Relation Age of Onset  . AAA (abdominal aortic aneurysm) Mother 30  . Deep vein thrombosis Mother   . Heart disease Mother   . Diabetes Mother   . COPD Father   . Bladder Cancer Father   . Depression Sister   . Hypertension Sister   . Obesity Sister   . Hyperlipidemia Sister   . Heart disease Sister   . Asthma Sister   . Obesity Sister     ROS:  Pertinent items are noted in HPI.  Otherwise, a comprehensive ROS  was negative.  Exam:   Ht  (1.626 m)   Wt 203 lb (92.1 kg)   LMP 05/27/2007   BMI 34.84 kg/m  Height:  (162.6 cm) Ht Readings from Last 3 Encounters:  10/30/16  (1.626 m)  10/01/16  (1.651 m)  10/29/15  (1.626 m)    General appearance: alert, cooperative and appears stated age Head: Normocephalic, without obvious abnormality, atraumatic Neck: no adenopathy, supple, symmetrical, trachea midline and thyroid normal to inspection and palpation Lungs: clear to auscultation bilaterally Breasts: normal appearance, no masses or tenderness, No nipple retraction or dimpling, No nipple discharge or bleeding, No axillary or supraclavicular adenopathy Heart: regular rate and rhythm Abdomen: soft, non-tender; no masses,  no organomegaly Extremities: extremities  normal, atraumatic, no cyanosis or edema Skin: Skin color, texture, turgor normal. No rashes or lesions Lymph nodes: Cervical, supraclavicular, and axillary nodes normal. No abnormal inguinal nodes palpated Neurologic: Grossly normal   Pelvic: External genitalia:  no lesions              Urethra:  normal appearing urethra with no masses, tenderness or lesions              Bartholin's and Skene's: normal                 Vagina: normal appearing vagina with normal color and discharge, no lesions              Cervix: multiparous appearance, no cervical motion tenderness and no lesions              Pap taken: No. Bimanual Exam:  Uterus:  normal size, contour, position, consistency, mobility, non-tender              Adnexa: normal adnexa and no mass, fullness, tenderness               Rectovaginal: Confirms               Anus:  normal sphincter tone, no lesions  Chaperone present: yes  A:  Well Woman with normal exam  Post menopausal no HRT  Osteoporosis will start Prolia with PCP soon  Screening labs  P:   Reviewed health and wellness pertinent to exam  Aware of need to call if vaginal bleeding  Patient will request copy of BMD for our records also  Lab: Vit.D, CBC  Pap smear: no   counseled on breast self exam, mammography screening, adequate intake of calcium and vitamin D, diet and exercise  return annually or prn  An After Visit Summary was printed and given to the patient.

## 2016-10-31 LAB — CBC
HEMOGLOBIN: 13.9 g/dL (ref 11.1–15.9)
Hematocrit: 42.9 % (ref 34.0–46.6)
MCH: 28.5 pg (ref 26.6–33.0)
MCHC: 32.4 g/dL (ref 31.5–35.7)
MCV: 88 fL (ref 79–97)
PLATELETS: 260 10*3/uL (ref 150–379)
RBC: 4.88 x10E6/uL (ref 3.77–5.28)
RDW: 13.9 % (ref 12.3–15.4)
WBC: 6 10*3/uL (ref 3.4–10.8)

## 2016-10-31 LAB — VITAMIN D 25 HYDROXY (VIT D DEFICIENCY, FRACTURES): VIT D 25 HYDROXY: 26.7 ng/mL — AB (ref 30.0–100.0)

## 2016-11-02 ENCOUNTER — Other Ambulatory Visit: Payer: Self-pay | Admitting: Certified Nurse Midwife

## 2016-11-02 DIAGNOSIS — E559 Vitamin D deficiency, unspecified: Secondary | ICD-10-CM

## 2016-11-05 ENCOUNTER — Ambulatory Visit (INDEPENDENT_AMBULATORY_CARE_PROVIDER_SITE_OTHER): Payer: Managed Care, Other (non HMO) | Admitting: Family Medicine

## 2016-11-05 ENCOUNTER — Encounter: Payer: Self-pay | Admitting: Family Medicine

## 2016-11-05 ENCOUNTER — Other Ambulatory Visit: Payer: Managed Care, Other (non HMO)

## 2016-11-05 VITALS — BP 120/74 | HR 75 | Ht 64.0 in | Wt 205.6 lb

## 2016-11-05 DIAGNOSIS — Z0000 Encounter for general adult medical examination without abnormal findings: Secondary | ICD-10-CM

## 2016-11-05 DIAGNOSIS — E785 Hyperlipidemia, unspecified: Secondary | ICD-10-CM

## 2016-11-05 DIAGNOSIS — E669 Obesity, unspecified: Secondary | ICD-10-CM | POA: Diagnosis not present

## 2016-11-05 DIAGNOSIS — Z79899 Other long term (current) drug therapy: Secondary | ICD-10-CM

## 2016-11-05 DIAGNOSIS — Z8249 Family history of ischemic heart disease and other diseases of the circulatory system: Secondary | ICD-10-CM | POA: Diagnosis not present

## 2016-11-05 DIAGNOSIS — M816 Localized osteoporosis [Lequesne]: Secondary | ICD-10-CM | POA: Diagnosis not present

## 2016-11-05 DIAGNOSIS — Z23 Encounter for immunization: Secondary | ICD-10-CM

## 2016-11-05 HISTORY — DX: Obesity, unspecified: E66.9

## 2016-11-05 LAB — POCT URINALYSIS DIP (PROADVANTAGE DEVICE)
Bilirubin, UA: NEGATIVE
Glucose, UA: NEGATIVE mg/dL
Ketones, POC UA: NEGATIVE mg/dL
Leukocytes, UA: NEGATIVE
NITRITE UA: NEGATIVE
PH UA: 7.5 (ref 5.0–8.0)
PROTEIN UA: NEGATIVE mg/dL
RBC UA: NEGATIVE
SPECIFIC GRAVITY, URINE: 1.01
UUROB: NEGATIVE

## 2016-11-05 MED ORDER — DENOSUMAB 60 MG/ML ~~LOC~~ SOLN
60.0000 mg | Freq: Once | SUBCUTANEOUS | Status: AC
  Administered 2016-11-05: 60 mg via SUBCUTANEOUS

## 2016-11-05 NOTE — Progress Notes (Signed)
Subjective:    Patient ID: Kayla Lewis, female    DOB: [birth year], 56 y.o.   MRN: [id number]  HPI Chief Complaint  Patient presents with  . fasting cpe    fasting cpe, weight issues, getting flu shot at work. had cbc and vitamin d checked last week at obgyn.    She is here for a complete physical exam. Previous medical care: Dr. Tresa Endo in [place].   Other providers: OB/GYN - Leota Sauers, CNM  Dr. Doreen Beam is Dermatologist- goes for annual skin checks.   Family history: breast cancer on her father's side in aunts  Heart disease in sister and mother.   States she had an ECG in 2016 and was told by her previous PCP that she should be evaluated by a cardiologist and have a stress test based on her family history.   Works out with a Psychologist, educational 2 times per week and yoga once per week. Lives alone. Has a dog. Divorced. Adult kids. 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  Diet: healthy except she eats a lot of carbs, especially potatoes.   Immunizations: Tdap up to date. Flu shot declines but will get this at her job.   Health maintenance:  Mammogram: March 2018 and negative  Colonoscopy: due in 2022  Last Gynecological Exam: up to date  Last Dental Exam: twice annually  Last Eye Exam: August 2018  Get annual skin checks at her dermatologist.   Wears seatbelt always, uses sunscreen, smoke detectors in home and functioning, does not text while driving and feels safe in home environment.   Reviewed allergies, medications, past medical, surgical, family, and social history.   Review of Systems Review of Systems Constitutional: -fever, -chills, -sweats, -unexpected weight change,-fatigue ENT: -runny nose, -ear pain, -sore throat Cardiology:  -chest pain, -palpitations, -edema Respiratory: -cough, -shortness of breath, -wheezing Gastroenterology: -abdominal pain, -nausea, -vomiting, -diarrhea, -constipation  Hematology: -bleeding or bruising problems Musculoskeletal:  -arthralgias, -myalgias, -joint swelling, -back pain Ophthalmology: -vision changes Urology: -dysuria, -difficulty urinating, -hematuria, -urinary frequency, -urgency Neurology: -headache, -weakness, -tingling, -numbness       Objective:   Physical Exam BP 120/74   Pulse 75   Ht  (1.626 m)   Wt 205 lb 9.6 oz (93.3 kg)   LMP 05/27/2007   BMI 35.29 kg/m   General Appearance:    Alert, cooperative, no distress, appears stated age  Head:    Normocephalic, without obvious abnormality, atraumatic  Eyes:    PERRL, conjunctiva/corneas clear, EOM's intact, fundi    benign  Ears:    Normal TM's and external ear canals  Nose:   Nares normal, mucosa normal, no drainage or sinus   tenderness  Throat:   Lips, mucosa, and tongue normal; teeth and gums normal  Neck:   Supple, no lymphadenopathy;  thyroid:  no   enlargement/tenderness/nodules; no carotid   bruit or JVD  Back:    Spine nontender, no curvature, ROM normal, no CVA     tenderness  Lungs:     Clear to auscultation bilaterally without wheezes, rales or     ronchi; respirations unlabored  Chest Wall:    No tenderness or deformity   Heart:    Regular rate and rhythm, S1 and S2 normal, no murmur, rub   or gallop  Breast Exam:    Done at OB/GYN   Abdomen:     Soft, non-tender, nondistended, normoactive bowel sounds,    noMagnus Sinningo hepatosplenomegaly  Genitalia:    Done at  OB/GYN     Extremities:   No clubbing, cyanosis or edema  Pulses:   2+ and symmetric all extremities  Skin:   Skin color, texture, turgor normal, no rashes or lesions  Lymph nodes:   Cervical, supraclavicular, and axillary nodes normal  Neurologic:   CNII-XII intact, normal strength, sensation and gait; reflexes 2+ and symmetric throughout          Psych:   Normal mood, affect, hygiene and grooming.    Urinalysis dipstick: negative       Assessment & Plan:  Routine general medical examination at a health care facility - Plan: POCT Urinalysis DIP  (Proadvantage Device), Comprehensive metabolic panel, EKG 12-Lead, TSH, Lipid panel  Needs flu shot  Localized osteoporosis without current pathological fracture - Plan: denosumab (PROLIA) injection 60 mg  Dyslipidemia - Plan: EKG 12-Lead, Lipid panel  Obesity (BMI 30-39.9) - Plan: EKG 12-Lead, TSH, Lipid panel  Family history of heart disease in female family member before age 25 - Plan: EKG 12-Lead  Form filled out for her insurance and employer.  Waist circumference 44 inches She is doing well and has a positive outlook on life.  Up to date with immunizations and health maintenance  She received her first Prolia injection today. Discussed possible side effects.  ECG done and shows NSR with a normal rate. No acute changes.  She would like to hold off on referral to cardiology for further workup due to family history of heart disease.  Follow up pending labs.

## 2016-11-05 NOTE — Patient Instructions (Signed)
Make sure you are getting at least 150 minutes of physical activity per week.   we will call you with your lab results or send them via patient portal if you are accessible.   Preventative Care for Adults - Female      MAINTAIN REGULAR HEALTH EXAMS:  A routine yearly physical is a good way to check in with your primary care provider about your health and preventive screening. It is also an opportunity to share updates about your health and any concerns you have, and receive a thorough all-over exam.   Most health insurance companies pay for at least some preventative services.  Check with your health plan for specific coverages.  WHAT PREVENTATIVE SERVICES DO WOMEN NEED?  Adult women should have their weight and blood pressure checked regularly.   Women age 60 and older should have their cholesterol levels checked regularly.  Women should be screened for cervical cancer with a Pap smear and pelvic exam beginning at either age 61, or 3 years after they become sexually activity.    Breast cancer screening generally begins at age 79 with a mammogram and breast exam by your primary care provider.    Beginning at age 70 and continuing to age 48, women should be screened for colorectal cancer.  Certain people may need continued testing until age 60.  Updating vaccinations is part of preventative care.  Vaccinations help protect against diseases such as the flu.  Osteoporosis is a disease in which the bones lose minerals and strength as we age. Women ages 55 and over should discuss this with their caregivers, as should women after menopause who have other risk factors.  Lab tests are generally done as part of preventative care to screen for anemia and blood disorders, to screen for problems with the kidneys and liver, to screen for bladder problems, to check blood sugar, and to check your cholesterol level.  Preventative services generally include counseling about diet, exercise, avoiding  tobacco, drugs, excessive alcohol consumption, and sexually transmitted infections.    GENERAL RECOMMENDATIONS FOR GOOD HEALTH:  Healthy diet:  Eat a variety of foods, including fruit, vegetables, animal or vegetable protein, such as meat, fish, chicken, and eggs, or beans, lentils, tofu, and grains, such as rice.  Drink plenty of water daily.  Decrease saturated fat in the diet, avoid lots of red meat, processed foods, sweets, fast foods, and fried foods.  Exercise:  Aerobic exercise helps maintain good heart health. At least 30-40 minutes of moderate-intensity exercise is recommended. For example, a brisk walk that increases your heart rate and breathing. This should be done on most days of the week.   Find a type of exercise or a variety of exercises that you enjoy so that it becomes a part of your daily life.  Examples are running, walking, swimming, water aerobics, and biking.  For motivation and support, explore group exercise such as aerobic class, spin class, Zumba, Yoga,or  martial arts, etc.    Set exercise goals for yourself, such as a certain weight goal, walk or run in a race such as a 5k walk/run.  Speak to your primary care provider about exercise goals.  Disease prevention:  If you smoke or chew tobacco, find out from your caregiver how to quit. It can literally save your life, no matter how long you have been a tobacco user. If you do not use tobacco, never begin.   Maintain a healthy diet and normal weight. Increased weight leads to problems  with blood pressure and diabetes.   The Body Mass Index or BMI is a way of measuring how much of your body is fat. Having a BMI above 27 increases the risk of heart disease, diabetes, hypertension, stroke and other problems related to obesity. Your caregiver can help determine your BMI and based on it develop an exercise and dietary program to help you achieve or maintain this important measurement at a healthful level.  High blood  pressure causes heart and blood vessel problems.  Persistent high blood pressure should be treated with medicine if weight loss and exercise do not work.   Fat and cholesterol leaves deposits in your arteries that can block them. This causes heart disease and vessel disease elsewhere in your body.  If your cholesterol is found to be high, or if you have heart disease or certain other medical conditions, then you may need to have your cholesterol monitored frequently and be treated with medication.   Ask if you should have a cardiac stress test if your history suggests this. A stress test is a test done on a treadmill that looks for heart disease. This test can find disease prior to there being a problem.  Menopause can be associated with physical symptoms and risks. Hormone replacement therapy is available to decrease these. You should talk to your caregiver about whether starting or continuing to take hormones is right for you.   Osteoporosis is a disease in which the bones lose minerals and strength as we age. This can result in serious bone fractures. Risk of osteoporosis can be identified using a bone density scan. Women ages 80 and over should discuss this with their caregivers, as should women after menopause who have other risk factors. Ask your caregiver whether you should be taking a calcium supplement and Vitamin D, to reduce the rate of osteoporosis.   Avoid drinking alcohol in excess (more than two drinks per day).  Avoid use of street drugs. Do not share needles with anyone. Ask for professional help if you need assistance or instructions on stopping the use of alcohol, cigarettes, and/or drugs.  Brush your teeth twice a day with fluoride toothpaste, and floss once a day. Good oral hygiene prevents tooth decay and gum disease. The problems can be painful, unattractive, and can cause other health problems. Visit your dentist for a routine oral and dental check up and preventive care every  6-12 months.   Look at your skin regularly.  Use a mirror to look at your back. Notify your caregivers of changes in moles, especially if there are changes in shapes, colors, a size larger than a pencil eraser, an irregular border, or development of new moles.  Safety:  Use seatbelts 100% of the time, whether driving or as a passenger.  Use safety devices such as hearing protection if you work in environments with loud noise or significant background noise.  Use safety glasses when doing any work that could send debris in to the eyes.  Use a helmet if you ride a bike or motorcycle.  Use appropriate safety gear for contact sports.  Talk to your caregiver about gun safety.  Use sunscreen with a SPF (or skin protection factor) of 15 or greater.  Lighter skinned people are at a greater risk of skin cancer. Don't forget to also wear sunglasses in order to protect your eyes from too much damaging sunlight. Damaging sunlight can accelerate cataract formation.   Practice safe sex. Use condoms. Condoms are used for  birth control and to help reduce the spread of sexually transmitted infections (or STIs).  Some of the STIs are gonorrhea (the clap), chlamydia, syphilis, trichomonas, herpes, HPV (human papilloma virus) and HIV (human immunodeficiency virus) which causes AIDS. The herpes, HIV and HPV are viral illnesses that have no cure. These can result in disability, cancer and death.   Keep carbon monoxide and smoke detectors in your home functioning at all times. Change the batteries every 6 months or use a model that plugs into the wall.   Vaccinations:  Stay up to date with your tetanus shots and other required immunizations. You should have a booster for tetanus every 10 years. Be sure to get your flu shot every year, since 5%-20% of the [place] population comes down with the flu. The flu vaccine changes each year, so being vaccinated once is not enough. Get your shot in the fall, before the flu season peaks.    Other vaccines to consider:  Human Papilloma Virus or HPV causes cancer of the cervix, and other infections that can be transmitted from person to person. There is a vaccine for HPV, and females should get immunized between the ages of 57 and 73. It requires a series of 3 shots.   Pneumococcal vaccine to protect against certain types of pneumonia.  This is normally recommended for adults age 50 or older.  However, adults younger than 57 years old with certain underlying conditions such as diabetes, heart or lung disease should also receive the vaccine.  Shingles vaccine to protect against Varicella Zoster if you are older than age 2, or younger than 57 years old with certain underlying illness.  Hepatitis A vaccine to protect against a form of infection of the liver by a virus acquired from food.  Hepatitis B vaccine to protect against a form of infection of the liver by a virus acquired from blood or body fluids, particularly if you work in health care.  If you plan to travel internationally, check with your local health department for specific vaccination recommendations.  Cancer Screening:  Breast cancer screening is essential to preventive care for women. All women age 66 and older should perform a breast self-exam every month. At age 17 and older, women should have their caregiver complete a breast exam each year. Women at ages 68 and older should have a mammogram (x-ray film) of the breasts. Your caregiver can discuss how often you need mammograms.    Cervical cancer screening includes taking a Pap smear (sample of cells examined under a microscope) from the cervix (end of the uterus). It also includes testing for HPV (Human Papilloma Virus, which can cause cervical cancer). Screening and a pelvic exam should begin at age 49, or 3 years after a woman becomes sexually active. Screening should occur every year, with a Pap smear but no HPV testing, up to age 46. After age 73, you should have  a Pap smear every 3 years with HPV testing, if no HPV was found previously.   Most routine colon cancer screening begins at the age of 63. On a yearly basis, doctors may provide special easy to use take-home tests to check for hidden blood in the stool. Sigmoidoscopy or colonoscopy can detect the earliest forms of colon cancer and is life saving. These tests use a small camera at the end of a tube to directly examine the colon. Speak to your caregiver about this at age 67, when routine screening begins (and is repeated every 5 years  unless early forms of pre-cancerous polyps or small growths are found).

## 2016-11-06 LAB — COMPREHENSIVE METABOLIC PANEL
AG RATIO: 1.6 (calc) (ref 1.0–2.5)
ALT: 12 U/L (ref 6–29)
AST: 22 U/L (ref 10–35)
Albumin: 4.2 g/dL (ref 3.6–5.1)
Alkaline phosphatase (APISO): 91 U/L (ref 33–130)
BUN: 12 mg/dL (ref 7–25)
CO2: 25 mmol/L (ref 20–32)
CREATININE: 1.02 mg/dL (ref 0.50–1.05)
Calcium: 9.7 mg/dL (ref 8.6–10.4)
Chloride: 107 mmol/L (ref 98–110)
GLUCOSE: 99 mg/dL (ref 65–99)
Globulin: 2.7 g/dL (calc) (ref 1.9–3.7)
Potassium: 4.1 mmol/L (ref 3.5–5.3)
Sodium: 141 mmol/L (ref 135–146)
TOTAL PROTEIN: 6.9 g/dL (ref 6.1–8.1)
Total Bilirubin: 0.5 mg/dL (ref 0.2–1.2)

## 2016-11-06 LAB — TSH: TSH: 3.11 m[IU]/L (ref 0.40–4.50)

## 2016-11-06 LAB — LIPID PANEL
Cholesterol: 208 mg/dL — ABNORMAL HIGH (ref <200)
HDL: 51 mg/dL (ref >50)
LDL Cholesterol (Calc): 129 mg/dL (calc) — ABNORMAL HIGH
Non-HDL Cholesterol (Calc): 157 mg/dL (calc) — ABNORMAL HIGH (ref <130)
Total CHOL/HDL Ratio: 4.1 (calc) (ref <5.0)
Triglycerides: 164 mg/dL — ABNORMAL HIGH (ref <150)

## 2016-11-06 LAB — EXTRA LAV TOP TUBE

## 2016-11-09 ENCOUNTER — Telehealth: Payer: Self-pay

## 2016-11-09 ENCOUNTER — Other Ambulatory Visit: Payer: Self-pay | Admitting: Family Medicine

## 2016-11-09 DIAGNOSIS — R9431 Abnormal electrocardiogram [ECG] [EKG]: Secondary | ICD-10-CM | POA: Insufficient documentation

## 2016-11-09 DIAGNOSIS — Z8249 Family history of ischemic heart disease and other diseases of the circulatory system: Secondary | ICD-10-CM | POA: Insufficient documentation

## 2016-11-09 NOTE — Telephone Encounter (Signed)
LM that per further review with JCL and Vickie that it is a good idea for her to get a screening visit to cardiology. She said it was not urgent, but thinks it is just a good idea to get this checked out from EKG. Trixie Rude

## 2016-11-10 NOTE — Telephone Encounter (Signed)
Pt was notified.  

## 2016-11-14 NOTE — Progress Notes (Addendum)
Cardiology Office Note   Date:  11/19/2016   ID:  Kayla Lewis, DOB [birth year], MRN [id number]  PCP:  Avanell ShackletonHenson, Vickie L, NP-C  Cardiologist:   Peter SwazilandJordan, MD   Chief Complaint  Patient presents with  . Follow-up    Abnormal EKG.  . Edema      History of Present Illness: Kayla Lewis is a 56 y.o. female who is seen at the request of Dr. Suezanne JacquetHenson for evaluation strong family history of CAD and abnormal Ecg. She has a history of HLD. She reports that she has gained over 20 lbs in the last 6 months. She has osteoarthritis of the hip that has limited her activity.  She has been inactive and gained weight. She did notice that she got more SOB with activity such as walking up stairs or an incline. Also not as much energy. Over the last 2 months she has been working with a Systems analystpersonal trainer and is making progress. She does have a family history of premature CAD in her sister and mother. No prior cardiac history. 2 years ago she had a lot of epigastric pain and had an upper EGD showing a hiatal hernia and gastritis.     Past Medical History:  Diagnosis Date  . Erosive gastritis   . Hiatal hernia   . History of abnormal Pap smear 1991  . Migraine age 56's   with aura  . Obesity (BMI 30-39.9) 11/05/2016  . Osteoporosis   . Shingles outbreak 06/2008   abdomen    Past Surgical History:  Procedure Laterality Date  . BREAST SURGERY Left 1997   lumpectomy benign  . CERVICAL POLYPECTOMY  1999  . CERVIX LESION DESTRUCTION    . COLONOSCOPY W/ BIOPSIES  09/22/10   1 polyp benign recheck in 10 years.  . TONSILLECTOMY  age 56     Current Outpatient Prescriptions  Medication Sig Dispense Refill  . Ca Phosphate-Cholecalciferol (CALCIUM/VITAMIN D3 GUMMIES PO) Take by mouth. Nature made brand 500mg  calcium twice a day/vitamin D3 700 IU daily    . cetirizine (ZYRTEC) 10 MG tablet Take 10 mg by mouth 2 (two) times daily.     . Cholecalciferol (VITAMIN D3) 1000 units CAPS Take 2,000 Units  by mouth.    . Glucosamine-Chondroitin (OSTEO BI-FLEX REGULAR STRENGTH PO) Take by mouth 2 (two) times daily.    Marland Kitchen. LOVAZA 1 G capsule Take 1 capsule by mouth daily.    . Multiple Vitamins-Minerals (WOMENS ONE DAILY PO) Take by mouth.    . ranitidine (ZANTAC) 150 MG tablet Take 150 mg by mouth 2 (two) times daily.    . RESTASIS 0.05 % ophthalmic emulsion INSTILL 1 DROP INTO AFFECTED EYE(S) BY OPHTHALMIC ROUTE EVERY 12 HOURS  1  . SUMAtriptan (IMITREX) 100 MG tablet     . topiramate (TOPAMAX) 200 MG tablet Take 1 tablet by mouth daily.     No current facility-administered medications for this visit.     Allergies:   Codeine; Esomeprazole magnesium; Ibuprofen; Nsaids; Penicillins; and Tramadol    Social History:  The patient  reports that she quit smoking about 18 years ago. Her smoking use included Cigarettes. She has a 1.25 pack-year smoking history. She has never used smokeless tobacco. She reports that she does not drink alcohol or use drugs.   Family History:  The patient's family history includes AAA (abdominal aortic aneurysm) (age of onset: 355) in her mother; Asthma in her sister; Bladder Cancer in her father; COPD  in her father; Deep vein thrombosis in her mother; Depression in her sister; Diabetes in her mother; Heart disease in her mother; Heart disease (age of onset: 30) in her sister; Hyperlipidemia in her sister; Hypertension in her sister; Obesity in her sister and sister.    ROS:  Please see the history of present illness.   Otherwise, review of systems are positive for none.   All other systems are reviewed and negative.    PHYSICAL EXAM: VS:  BP 122/84   Pulse 74   Ht 5\' 4"  (1.626 m)   Wt 206 lb (93.4 kg)   LMP 05/27/2007   BMI 35.36 kg/m  , BMI Body mass index is 35.36 kg/m. GEN: Well nourished, overweight,  in no acute distress  HEENT: normal  Neck: no JVD, carotid bruits, or masses Cardiac: RRR; no murmurs, rubs, or gallops,no edema  Respiratory:  clear to  auscultation bilaterally, normal work of breathing GI: soft, nontender, nondistended, + BS MS: no deformity or atrophy  Skin: warm and dry, no rash Neuro:  Strength and sensation are intact Psych: euthymic mood, full affect   EKG:  EKG is ordered today. The ekg ordered today demonstrates NSR with rate 74. Mild T wave inversion in leads 1 and Avl and biphasic T wave in V2. I have personally reviewed and interpreted this study.    Recent Labs: 10/30/2016: Hemoglobin 13.9; Platelets 260 11/05/2016: ALT 12; BUN 12; Creat 1.02; Potassium 4.1; Sodium 141; TSH 3.11    Lipid Panel    Component Value Date/Time   CHOL 208 (H) 11/05/2016 0958   TRIG 164 (H) 11/05/2016 0958   HDL 51 11/05/2016 0958   CHOLHDL 4.1 11/05/2016 0958      Wt Readings from Last 3 Encounters:  11/19/16 206 lb (93.4 kg)  11/05/16 205 lb 9.6 oz (93.3 kg)  10/30/16 203 lb (92.1 kg)      Other studies Reviewed: Additional studies/ records that were reviewed today include:  Labs dated 06/25/16: cholesterol 180, triglycerides 214, HDL 48, LDL 130. CBC, CMET, TSH normal.    ASSESSMENT AND PLAN:  1. Dyspnea on exertion. I do suspect this is predominantly related to deconditioning and weight gain. Her family history is very concerning. She does have nonspecific T wave changes on Ecg. I have recommended a coronary CT calcium score to help stratify her risk. If calcium score is 0 or low then I would focus on her training and weight loss. If high calcium score then further evaluation with nuclear stress testing would be justified.  2. Mild hypercholesterolemia. LDL 130. Again if calcium score is elevated this would argue for aggressive cholesterol therapy with statin.    Current medicines are reviewed at length with the patient today.  The patient does not have concerns regarding medicines.  The following changes have been made:  no change  Labs/ tests ordered today include:   Orders Placed This Encounter    Procedures  . CT CARDIAC SCORING  . EKG 12-Lead     Disposition:   FU with me TBD based on above testing.   Signed, Peter Swaziland, MD  11/19/2016 8:00 PM    [facility] [address] [place], [place], [address] Phone [phone], Fax [phone]

## 2016-11-19 ENCOUNTER — Encounter: Payer: Self-pay | Admitting: Cardiology

## 2016-11-19 ENCOUNTER — Ambulatory Visit (INDEPENDENT_AMBULATORY_CARE_PROVIDER_SITE_OTHER): Payer: Managed Care, Other (non HMO) | Admitting: Cardiology

## 2016-11-19 VITALS — BP 122/84 | HR 74 | Ht 64.0 in | Wt 206.0 lb

## 2016-11-19 DIAGNOSIS — R0609 Other forms of dyspnea: Secondary | ICD-10-CM | POA: Insufficient documentation

## 2016-11-19 DIAGNOSIS — Z8249 Family history of ischemic heart disease and other diseases of the circulatory system: Secondary | ICD-10-CM

## 2016-11-19 DIAGNOSIS — E7800 Pure hypercholesterolemia, unspecified: Secondary | ICD-10-CM | POA: Diagnosis not present

## 2016-11-19 HISTORY — DX: Other forms of dyspnea: R06.09

## 2016-11-19 NOTE — Patient Instructions (Signed)
Schedule cardiac calcium score

## 2016-12-01 ENCOUNTER — Other Ambulatory Visit: Payer: Managed Care, Other (non HMO)

## 2016-12-02 ENCOUNTER — Ambulatory Visit (INDEPENDENT_AMBULATORY_CARE_PROVIDER_SITE_OTHER)
Admission: RE | Admit: 2016-12-02 | Discharge: 2016-12-02 | Disposition: A | Discharge status: Home / Self Care | Payer: Self-pay | Source: Ambulatory Visit | Attending: Cardiology | Admitting: Cardiology

## 2016-12-02 DIAGNOSIS — E7800 Pure hypercholesterolemia, unspecified: Secondary | ICD-10-CM

## 2016-12-02 DIAGNOSIS — Z8249 Family history of ischemic heart disease and other diseases of the circulatory system: Secondary | ICD-10-CM

## 2016-12-02 DIAGNOSIS — R0609 Other forms of dyspnea: Secondary | ICD-10-CM

## 2017-02-16 ENCOUNTER — Encounter: Payer: Self-pay | Admitting: Family Medicine

## 2017-02-16 ENCOUNTER — Ambulatory Visit (INDEPENDENT_AMBULATORY_CARE_PROVIDER_SITE_OTHER): Payer: Managed Care, Other (non HMO) | Admitting: Family Medicine

## 2017-02-16 VITALS — BP 128/84 | HR 88 | Temp 97.9°F | Ht 64.0 in | Wt 202.0 lb

## 2017-02-16 DIAGNOSIS — J069 Acute upper respiratory infection, unspecified: Secondary | ICD-10-CM | POA: Diagnosis not present

## 2017-02-16 DIAGNOSIS — R05 Cough: Secondary | ICD-10-CM | POA: Diagnosis not present

## 2017-02-16 DIAGNOSIS — R059 Cough, unspecified: Secondary | ICD-10-CM

## 2017-02-16 MED ORDER — AZITHROMYCIN 250 MG PO TABS: ORAL_TABLET | ORAL | 0 refills | Status: DC

## 2017-02-16 MED ORDER — ALBUTEROL SULFATE HFA 108 (90 BASE) MCG/ACT IN AERS
2.0000 | INHALATION_SPRAY | Freq: Four times a day (QID) | RESPIRATORY_TRACT | 0 refills | Status: DC | PRN
Start: 2017-02-16 — End: 2020-01-22

## 2017-02-16 NOTE — Progress Notes (Signed)
Chief Complaint  Patient presents with  . Cough    and really tired last week. By Friday last week was congested in her head and left work and went home and tried to sleep. Sat temp of 100. Mucus was clear and now turning yellow. Chest is beginning to get tight, was feeling much better yesterday and now getting worse again.     Started with fatigue last week ([place] 58 [place] the weekend of [place]13[place] thinks she was tired from that.  By Friday 1/18 she developed body aches, congestion.  She left work at noon, took tylenol, rested.  Low grade temp 100 over the weekend, head congestion and worsening cough.  She laid around most of the weekend.  Started feeling better 1/20, fever improved, energy started to improve.  Nasal mucus has remained clear. Cough was dry initially, but is now getting up phlegm (not purulent). Woke up feeling great Monday. Went to work Monday, still had some cough. Last night cough got worse, chest started getting tight, more congested. Nasal drainage changed to yellow and thick.  She is having pain in her upper teeth bilaterally, intermittently.   Used Tylenol cold and flu, day and night through the weekend. Took OTC meds today--tylenol cold and flu before work, and dayquil at least 4 hours later. It helped with her sinus pain some.  She has had wheezing in the past, still has a ProAir at home, but is expired (07/2016).  Not sure if it helped. She keeps it at work, would like one for home.  PMH, PSH, SH reviewed  Outpatient Encounter Medications as of 02/16/2017  Medication Sig  . Ca Phosphate-Cholecalciferol (CALCIUM/VITAMIN D3 GUMMIES PO) Take by mouth. Nature made brand 500mg  calcium twice a day/vitamin D3 700 IU daily  . cetirizine (ZYRTEC) 10 MG tablet Take 10 mg by mouth 2 (two) times daily.   . Cholecalciferol (VITAMIN D3) 1000 units CAPS Take 2,000 Units by mouth.  Marland Kitchen. LOVAZA 1 G capsule Take 1 capsule by mouth daily.  . Multiple Vitamins-Minerals  (WOMENS ONE DAILY PO) Take by mouth.  . Pseudoephedrine-APAP-DM (DAYQUIL PO) Take 2 capsules by mouth.  . ranitidine (ZANTAC) 150 MG tablet Take 150 mg by mouth 2 (two) times daily.  Marland Kitchen. topiramate (TOPAMAX) 200 MG tablet Take 1 tablet by mouth daily.  . Glucosamine-Chondroitin (OSTEO BI-FLEX REGULAR STRENGTH PO) Take by mouth 2 (two) times daily.  . RESTASIS 0.05 % ophthalmic emulsion INSTILL 1 DROP INTO AFFECTED EYE(S) BY OPHTHALMIC ROUTE EVERY 12 HOURS  . SUMAtriptan (IMITREX) 100 MG tablet    No facility-administered encounter medications on file as of 02/16/2017.    Allergies  Allergen Reactions  . Codeine   . Esomeprazole Magnesium   . Ibuprofen   . Nsaids   . Penicillins   . Tramadol Rash   ROS: Slight nausea, decreased appetite.  No vomiting, diarrhea Migraine this morning. Has been waking up with frontal headaches recently.  No urinary complaints. No bleeding, bruising.  Slight rash at left forearm/elbow for a few days--bumps, no itching. No chest pain (just tightness per HPI), no palpitations.  PHYSICAL EXAM:  BP 128/84   Pulse 88   Temp 97.9 F (36.6 C) (Tympanic)   Ht 5\' 4"  (1.626 m)   Wt 202 lb (91.6 kg)   LMP 05/27/2007   BMI 34.67 kg/m   Well appearing, pleasant female, with infrequent wet-sounding cough. HEENT: PERRL, EOMI, conjunctiva and sclera are clear. Nasal mucosa is moderately  edematous, some erythema, but clear mucus, more on the left. Mildly tender over both maxillary sinuses.  OP is clear. TM's and EAC's normal. Neck: no lymphadenopathy Heart: regular rate and rhythm Lungs: clear bilaterally. No wheezes, rales, ronchi Neuro: alert and oriented, cranial nerves intact, normal gait Psych: normal mood, affect hygiene and grooming Extremities: no edema  ASSESSMENT/PLAN:  Cough - Plan: albuterol (PROVENTIL HFA;VENTOLIN HFA) 108 (90 Base) MCG/ACT inhaler, azithromycin (ZITHROMAX) 250 MG tablet  Upper respiratory tract infection, unspecified type -  viral vs early sinusitis; supportive measures reviewed and start ABX in 1-2d if not improving    Drink plenty of fluids. You can either continue the over-the-counter sinus medications you have been taking, and add Mucinex 12 hour if they do not contain guaifenesin, OR If you prefer to have a longer-acting regimen, you can switch to 12 hour mucinex (plain or max dose), 12 hour sudafed (your choice if you take twice daily or just in the morning, depending if it keeps you awake at night) and Delsym syrup (12 hour dextromethorphan cough syrup) as needed for cough (likely just to take at bedtime if cough isn't too bad during the day).  If your chest congestion doesn't significantly improving (or sinus pain) in the next 1-2 days, go ahead and start the antibiotic.  Call us in 5-7 days if you are WORSE rather than starting to improve, or call us on day 10-11 if you are better, but not 100% (for extension of the antibiotic course).  Use the inhaler if needed for wheezing or shortness of breath (I didn't hear any today, I suspect the chest congestion in the bronchial tubes is what is causing your heaviness/tightness).

## 2017-02-16 NOTE — Patient Instructions (Signed)
   Drink plenty of fluids. You can either continue the over-the-counter sinus medications you have been taking, and add Mucinex 12 hour if they do not contain guaifenesin, OR If you prefer to have a longer-acting regimen, you can switch to 12 hour mucinex (plain or max dose), 12 hour sudafed (your choice if you take twice daily or just in the morning, depending if it keeps you awake at night) and Delsym syrup (12 hour dextromethorphan cough syrup) as needed for cough (likely just to take at bedtime if cough isn't too bad during the day).  If your chest congestion doesn't significantly improving (or sinus pain) in the next 1-2 days, go ahead and start the antibiotic.  Call us in 5-7 days if you are WORSE rather than starting to improve, or call us on day 10-11 if you are better, but not 100% (for extension of the antibiotic course).  Use the inhaler if needed for wheezing or shortness of breath (I didn't hear any today, I suspect the chest congestion in the bronchial tubes is what is causing your heaviness/tightness).

## 2017-04-05 ENCOUNTER — Other Ambulatory Visit: Payer: Self-pay | Admitting: Family Medicine

## 2017-04-05 DIAGNOSIS — Z1231 Encounter for screening mammogram for malignant neoplasm of breast: Secondary | ICD-10-CM

## 2017-04-21 ENCOUNTER — Ambulatory Visit
Admission: RE | Admit: 2017-04-21 | Discharge: 2017-04-21 | Disposition: A | Discharge status: Home / Self Care | Payer: Managed Care, Other (non HMO) | Source: Ambulatory Visit | Attending: Family Medicine | Admitting: Family Medicine

## 2017-04-21 DIAGNOSIS — Z1231 Encounter for screening mammogram for malignant neoplasm of breast: Secondary | ICD-10-CM

## 2017-04-22 ENCOUNTER — Other Ambulatory Visit: Payer: Self-pay | Admitting: Family Medicine

## 2017-04-22 DIAGNOSIS — R928 Other abnormal and inconclusive findings on diagnostic imaging of breast: Secondary | ICD-10-CM

## 2017-04-26 ENCOUNTER — Ambulatory Visit
Admission: RE | Admit: 2017-04-26 | Discharge: 2017-04-26 | Disposition: A | Discharge status: Home / Self Care | Payer: Managed Care, Other (non HMO) | Source: Ambulatory Visit | Attending: Family Medicine | Admitting: Family Medicine

## 2017-04-26 DIAGNOSIS — R928 Other abnormal and inconclusive findings on diagnostic imaging of breast: Secondary | ICD-10-CM

## 2017-05-11 ENCOUNTER — Other Ambulatory Visit (INDEPENDENT_AMBULATORY_CARE_PROVIDER_SITE_OTHER): Payer: Managed Care, Other (non HMO)

## 2017-05-11 DIAGNOSIS — M810 Age-related osteoporosis without current pathological fracture: Secondary | ICD-10-CM

## 2017-05-11 MED ORDER — DENOSUMAB 60 MG/ML ~~LOC~~ SOLN
60.0000 mg | Freq: Once | SUBCUTANEOUS | Status: AC
  Administered 2017-05-11: 60 mg via SUBCUTANEOUS

## 2017-05-19 ENCOUNTER — Telehealth: Payer: Self-pay | Admitting: Family Medicine

## 2017-05-19 NOTE — Telephone Encounter (Signed)
Pt called and is wanting to know if you will take over refilling her Zantac she does not need a refill yet, she does not want to go back to the gastro dr, states she is not having any symptoms,enough to go back to the gastro dr for them just to fill that medicine, pt uses [id number] - [place], [place] - [address] pt can be reached at [phone]

## 2017-05-19 NOTE — Telephone Encounter (Signed)
I'm fine refilling her Zantac.

## 2017-05-19 NOTE — Telephone Encounter (Signed)
Pt was notified and will let us know when we need refill it

## 2017-07-27 NOTE — Progress Notes (Signed)
Subjective:    Patient ID: Kayla Lewis, female    DOB: April 05, 1960, 57 y.o.   MRN: [id number]  HPI Chief Complaint  Patient presents with  . fasting cpe    fasting cpe, right hip pain and ankles are very achy. not sure if its prolia injection. gets eye exam done yearly and has an appt coming up. would like additional blood work. sees obgyn   She is here for a complete physical exam. Previous medical care: Dr. Tresa Endo [facility]  Last CPE: last one   Complains of right hip pain and bilateral ankle pain for the past for the past 6-8 months. States ankle pain is constant. Pain is worse after sitting or sleeping and getting up. Movement causing her to ache. No swelling, no redness or increased warmth. Bottom of her feet numb and tingling after walking yesterday, this was the first time.  Uses ice. Takes Tylenol. She has been to [facility] in the past. History of right hip pain. No known injury.  Cannot take NSAIDs due to erosive gastritis.   Thinks she has plantar fascitis to her right foot as well. She has had this in the past.   Osteoporosis diagnosed in 2013. Taking Prolia. Has had 2 injections so far DEXA June 2018  Last Prolia injection in April 2019.  She questions whether ankle pain may be related to Prolia.   Tried oral bisphos in the past and had joint pain.   She saw cardiology recently and had a 0 coronary CT calcium score Other providers: Cardiologist- Dr. Swaziland  GI- Dr. Loreta Ave  OB/GYN- Dr. Uvaldo Bristle orthopedics.  Dermatologist- Dr. Lemar Livings smoking, drinking alcohol, drug use  Diet: fairly healthy. Plant based. Tempeh and tofu for the past year  Excerise: twice weekly with a trainer. Walks at least 10,000 steps daily. Stationary bike.   Immunizations:  Tdap up to date. Zoster in 2012   Health maintenance:  Mammogram: 04/2017 Colonoscopy: Due in 2021 Last Gynecological Exam: Pap Smear in 2019  Last Menstrual cycle: early  menopause  Last Dental Exam: next week  Last Eye Exam: appt in August  Annual skin checks.   Wears seatbelt always, uses sunscreen, smoke detectors in home and functioning, does not text while driving and feels safe in home environment.   Reviewed allergies, medications, past medical, surgical, family, and social history.   Review of Systems Review of Systems Constitutional: -fever, -chills, -sweats, -unexpected weight change,-fatigue ENT: -runny nose, -ear pain, -sore throat Cardiology:  -chest pain, -palpitations, -edema Respiratory: -cough, -shortness of breath, -wheezing Gastroenterology: -abdominal pain, -nausea, -vomiting, -diarrhea, -constipation  Hematology: -bleeding or bruising problems Musculoskeletal: +arthralgias, -myalgias, -joint swelling, -back pain Ophthalmology: -vision changes Urology: -dysuria, -difficulty urinating, -hematuria, -urinary frequency, -urgency Neurology: -headache, -weakness, -tingling, -numbness       Objective:   Physical Exam BP 122/80   Pulse 78   Ht 5\' 4"  (1.626 m)   Wt 202 lb 6.4 oz (91.8 kg)   LMP 05/27/2007   HC 42" (106.7 cm) Comment: waist circumference  BMI 34.74 kg/m   General Appearance:    Alert, cooperative, no distress, appears stated age  Head:    Normocephalic, without obvious abnormality, atraumatic  Eyes:    PERRL, conjunctiva/corneas clear, EOM's intact, fundi    benign  Ears:    Normal TM's and external ear canals  Nose:   Nares normal, mucosa normal, no drainage or sinus   tenderness  Throat:   Lips,  mucosa, and tongue normal; teeth and gums normal  Neck:   Supple, no lymphadenopathy;  thyroid:  no   enlargement/tenderness/nodules; no carotid   bruit or JVD  Back:    Spine nontender, no curvature, ROM normal, no CVA     tenderness  Lungs:     Clear to auscultation bilaterally without wheezes, rales or     ronchi; respirations unlabored  Chest Wall:    No tenderness or deformity   Heart:    Regular rate and  rhythm, S1 and S2 normal, no murmur, rub   or gallop  Breast Exam:    OB/GYN  Abdomen:     Soft, non-tender, nondistended, normoactive bowel sounds,    no masses, no hepatosplenomegaly  Genitalia:    OB/GYN     Extremities:   No clubbing, cyanosis or edema. Right hip with normal ROM, pain with internal rotation. Right heel tenderness, ankle and foot exam otherwise negative.   Pulses:   2+ and symmetric all extremities  Skin:   Skin color, texture, turgor normal, no rashes or lesions  Lymph nodes:   Cervical, supraclavicular, and axillary nodes normal  Neurologic:   CNII-XII intact, normal strength, sensation and gait; reflexes 2+ and symmetric throughout          Psych:   Normal mood, affect, hygiene and grooming.    Urinalysis dipstick: negative       Assessment & Plan:  Routine general medical examination at a health care facility - Plan: POCT Urinalysis DIP (Proadvantage Device), CBC with Differential/Platelet, Comprehensive metabolic panel, TSH, Lipid panel  Dyslipidemia - Plan: Lipid panel  Osteoporosis, unspecified osteoporosis type, unspecified pathological fracture presence  Vitamin D deficiency - Plan: VITAMIN D 25 Hydroxy (Vit-D Deficiency, Fractures)  Medication management - Plan: VITAMIN D 25 Hydroxy (Vit-D Deficiency, Fractures)  Chronic pain of both ankles - Plan: Sedimentation rate, DG Ankle Complete Right  Chronic right hip pain - Plan: Sedimentation rate, DG HIP UNILAT WITH PELVIS 2-3 VIEWS RIGHT  Cannot determine whether hip pain and ankle pain are related to Prolia or not. It is suspicious. Will send her for XR and have her follow up with her orthopedist.  She appears to be taking 2,000 IU units of vitamin D and will check her vitamin D level. No more than 500 mg of calcium at one time.  Check fasting lipids.  Immunizations up to date. Discussed new Shingrix vaccine and she will let me know.  Follow up pending labs and XR

## 2017-07-28 ENCOUNTER — Encounter: Payer: Self-pay | Admitting: Family Medicine

## 2017-07-28 ENCOUNTER — Ambulatory Visit (INDEPENDENT_AMBULATORY_CARE_PROVIDER_SITE_OTHER): Payer: Managed Care, Other (non HMO) | Admitting: Family Medicine

## 2017-07-28 VITALS — BP 122/80 | HR 78 | Ht 64.0 in | Wt 202.4 lb

## 2017-07-28 DIAGNOSIS — M25572 Pain in left ankle and joints of left foot: Secondary | ICD-10-CM

## 2017-07-28 DIAGNOSIS — M25571 Pain in right ankle and joints of right foot: Secondary | ICD-10-CM

## 2017-07-28 DIAGNOSIS — E785 Hyperlipidemia, unspecified: Secondary | ICD-10-CM | POA: Diagnosis not present

## 2017-07-28 DIAGNOSIS — M810 Age-related osteoporosis without current pathological fracture: Secondary | ICD-10-CM

## 2017-07-28 DIAGNOSIS — Z0000 Encounter for general adult medical examination without abnormal findings: Secondary | ICD-10-CM

## 2017-07-28 DIAGNOSIS — Z79899 Other long term (current) drug therapy: Secondary | ICD-10-CM | POA: Diagnosis not present

## 2017-07-28 DIAGNOSIS — M25551 Pain in right hip: Secondary | ICD-10-CM | POA: Diagnosis not present

## 2017-07-28 DIAGNOSIS — E559 Vitamin D deficiency, unspecified: Secondary | ICD-10-CM | POA: Diagnosis not present

## 2017-07-28 DIAGNOSIS — G8929 Other chronic pain: Secondary | ICD-10-CM

## 2017-07-28 LAB — POCT URINALYSIS DIP (PROADVANTAGE DEVICE)
Bilirubin, UA: NEGATIVE
Glucose, UA: NEGATIVE mg/dL
Ketones, POC UA: NEGATIVE mg/dL
Leukocytes, UA: NEGATIVE
NITRITE UA: NEGATIVE
PH UA: 8 (ref 5.0–8.0)
Protein Ur, POC: NEGATIVE mg/dL
RBC UA: NEGATIVE
SPECIFIC GRAVITY, URINE: 1.01
UUROB: NEGATIVE

## 2017-07-28 MED ORDER — RANITIDINE HCL 150 MG PO TABS: 150.0000 mg | ORAL_TABLET | Freq: Every day | ORAL | 5 refills | Status: DC

## 2017-07-28 NOTE — Patient Instructions (Addendum)
You can go to [facility] for your X Rays at your convenience.  Do not take more than 500 mg of supplemental calcium at one time.  Make sure you are getting at least 150 minutes of physical activity per week.   We will call you with your lab results.    Preventative Care for Adults - Female      MAINTAIN REGULAR HEALTH EXAMS:  A routine yearly physical is a good way to check in with your primary care provider about your health and preventive screening. It is also an opportunity to share updates about your health and any concerns you have, and receive a thorough all-over exam.   Most health insurance companies pay for at least some preventative services.  Check with your health plan for specific coverages.  WHAT PREVENTATIVE SERVICES DO WOMEN NEED?  Adult women should have their weight and blood pressure checked regularly.   Women age 25 and older should have their cholesterol levels checked regularly.  Women should be screened for cervical cancer with a Pap smear and pelvic exam beginning at age 24.  Breast cancer screening generally begins at age 53 with a mammogram and breast exam by your primary care provider.    Beginning at age 42 and continuing to age 83, women should be screened for colorectal cancer.  Certain people may need continued testing until age 83.  Updating vaccinations is part of preventative care.  Vaccinations help protect against diseases such as the flu.  Osteoporosis is a disease in which the bones lose minerals and strength as we age. Women ages 29 and over should discuss this with their caregivers, as should women after menopause who have other risk factors.  Lab tests are generally done as part of preventative care to screen for anemia and blood disorders, to screen for problems with the kidneys and liver, to screen for bladder problems, to check blood sugar, and to check your cholesterol level.  Preventative services generally include counseling  about diet, exercise, avoiding tobacco, drugs, excessive alcohol consumption, and sexually transmitted infections.    GENERAL RECOMMENDATIONS FOR GOOD HEALTH:  Healthy diet:  Eat a variety of foods, including fruit, vegetables, animal or vegetable protein, such as meat, fish, chicken, and eggs, or beans, lentils, tofu, and grains, such as rice.  Drink plenty of water daily.  Decrease saturated fat in the diet, avoid lots of red meat, processed foods, sweets, fast foods, and fried foods.  Exercise:  Aerobic exercise helps maintain good heart health. At least 30-40 minutes of moderate-intensity exercise is recommended. For example, a brisk walk that increases your heart rate and breathing. This should be done on most days of the week.   Find a type of exercise or a variety of exercises that you enjoy so that it becomes a part of your daily life.  Examples are running, walking, swimming, water aerobics, and biking.  For motivation and support, explore group exercise such as aerobic class, spin class, Zumba, Yoga,or  martial arts, etc.    Set exercise goals for yourself, such as a certain weight goal, walk or run in a race such as a 5k walk/run.  Speak to your primary care provider about exercise goals.  Disease prevention:  If you smoke or chew tobacco, find out from your caregiver how to quit. It can literally save your life, no matter how long you have been a tobacco user. If you do not use tobacco, never begin.   Maintain a healthy diet  and normal weight. Increased weight leads to problems with blood pressure and diabetes.   The Body Mass Index or BMI is a way of measuring how much of your body is fat. Having a BMI above 27 increases the risk of heart disease, diabetes, hypertension, stroke and other problems related to obesity. Your caregiver can help determine your BMI and based on it develop an exercise and dietary program to help you achieve or maintain this important measurement at a  healthful level.  High blood pressure causes heart and blood vessel problems.  Persistent high blood pressure should be treated with medicine if weight loss and exercise do not work.   Fat and cholesterol leaves deposits in your arteries that can block them. This causes heart disease and vessel disease elsewhere in your body.  If your cholesterol is found to be high, or if you have heart disease or certain other medical conditions, then you may need to have your cholesterol monitored frequently and be treated with medication.   Ask if you should have a cardiac stress test if your history suggests this. A stress test is a test done on a treadmill that looks for heart disease. This test can find disease prior to there being a problem.  Menopause can be associated with physical symptoms and risks. Hormone replacement therapy is available to decrease these. You should talk to your caregiver about whether starting or continuing to take hormones is right for you.   Osteoporosis is a disease in which the bones lose minerals and strength as we age. This can result in serious bone fractures. Risk of osteoporosis can be identified using a bone density scan. Women ages 45 and over should discuss this with their caregivers, as should women after menopause who have other risk factors. Ask your caregiver whether you should be taking a calcium supplement and Vitamin D, to reduce the rate of osteoporosis.   Avoid drinking alcohol in excess (more than two drinks per day).  Avoid use of street drugs. Do not share needles with anyone. Ask for professional help if you need assistance or instructions on stopping the use of alcohol, cigarettes, and/or drugs.  Brush your teeth twice a day with fluoride toothpaste, and floss once a day. Good oral hygiene prevents tooth decay and gum disease. The problems can be painful, unattractive, and can cause other health problems. Visit your dentist for a routine oral and dental check  up and preventive care every 6-12 months.   Look at your skin regularly.  Use a mirror to look at your back. Notify your caregivers of changes in moles, especially if there are changes in shapes, colors, a size larger than a pencil eraser, an irregular border, or development of new moles.  Safety:  Use seatbelts 100% of the time, whether driving or as a passenger.  Use safety devices such as hearing protection if you work in environments with loud noise or significant background noise.  Use safety glasses when doing any work that could send debris in to the eyes.  Use a helmet if you ride a bike or motorcycle.  Use appropriate safety gear for contact sports.  Talk to your caregiver about gun safety.  Use sunscreen with a SPF (or skin protection factor) of 15 or greater.  Lighter skinned people are at a greater risk of skin cancer. Don't forget to also wear sunglasses in order to protect your eyes from too much damaging sunlight. Damaging sunlight can accelerate cataract formation.   Practice  safe sex. Use condoms. Condoms are used for birth control and to help reduce the spread of sexually transmitted infections (or STIs).  Some of the STIs are gonorrhea (the clap), chlamydia, syphilis, trichomonas, herpes, HPV (human papilloma virus) and HIV (human immunodeficiency virus) which causes AIDS. The herpes, HIV and HPV are viral illnesses that have no cure. These can result in disability, cancer and death.   Keep carbon monoxide and smoke detectors in your home functioning at all times. Change the batteries every 6 months or use a model that plugs into the wall.   Vaccinations:  Stay up to date with your tetanus shots and other required immunizations. You should have a booster for tetanus every 10 years. Be sure to get your flu shot every year, since 5%-20% of the [place] population comes down with the flu. The flu vaccine changes each year, so being vaccinated once is not enough. Get your shot in the fall,  before the flu season peaks.   Other vaccines to consider:  Human Papilloma Virus or HPV causes cancer of the cervix, and other infections that can be transmitted from person to person. There is a vaccine for HPV, and females should get immunized between the ages of 2911 and 7826. It requires a series of 3 shots.   Pneumococcal vaccine to protect against certain types of pneumonia.  This is normally recommended for adults age 58 or older.  However, adults younger than 58 years old with certain underlying conditions such as diabetes, heart or lung disease should also receive the vaccine.  Shingles vaccine to protect against Varicella Zoster if you are older than age 58, or younger than 58 years old with certain underlying illness.  Hepatitis A vaccine to protect against a form of infection of the liver by a virus acquired from food.  Hepatitis B vaccine to protect against a form of infection of the liver by a virus acquired from blood or body fluids, particularly if you work in health care.  If you plan to travel internationally, check with your local health department for specific vaccination recommendations.  Cancer Screening:  Breast cancer screening is essential to preventive care for women. All women age 58 and older should perform a breast self-exam every month. At age 58 and older, women should have their caregiver complete a breast exam each year. Women at ages 3940 and older should have a mammogram (x-ray film) of the breasts. Your caregiver can discuss how often you need mammograms.    Cervical cancer screening includes taking a Pap smear (sample of cells examined under a microscope) from the cervix (end of the uterus). It also includes testing for HPV (Human Papilloma Virus, which can cause cervical cancer). Screening and a pelvic exam should begin at age 58, or 3 years after a woman becomes sexually active. Screening should occur every year, with a Pap smear but no HPV testing, up to age 58.  After age 58, you should have a Pap smear every 3 years with HPV testing, if no HPV was found previously.   Most routine colon cancer screening begins at the age of 58. On a yearly basis, doctors may provide special easy to use take-home tests to check for hidden blood in the stool. Sigmoidoscopy or colonoscopy can detect the earliest forms of colon cancer and is life saving. These tests use a small camera at the end of a tube to directly examine the colon. Speak to your caregiver about this at age 58, when routine  screening begins (and is repeated every 5 years unless early forms of pre-cancerous polyps or small growths are found).

## 2017-07-29 ENCOUNTER — Encounter: Payer: Self-pay | Admitting: Family Medicine

## 2017-07-29 LAB — COMPREHENSIVE METABOLIC PANEL
A/G RATIO: 1.9 (ref 1.2–2.2)
ALK PHOS: 74 IU/L (ref 39–117)
ALT: 7 IU/L (ref 0–32)
AST: 19 IU/L (ref 0–40)
Albumin: 4.6 g/dL (ref 3.5–5.5)
BUN/Creatinine Ratio: 13 (ref 9–23)
BUN: 13 mg/dL (ref 6–24)
Bilirubin Total: 0.3 mg/dL (ref 0.0–1.2)
CO2: 21 mmol/L (ref 20–29)
Calcium: 9.2 mg/dL (ref 8.7–10.2)
Chloride: 106 mmol/L (ref 96–106)
Creatinine, Ser: 0.97 mg/dL (ref 0.57–1.00)
GFR calc Af Amer: 75 mL/min/{1.73_m2} (ref >59)
GFR, EST NON AFRICAN AMERICAN: 65 mL/min/{1.73_m2} (ref >59)
GLOBULIN, TOTAL: 2.4 g/dL (ref 1.5–4.5)
Glucose: 96 mg/dL (ref 65–99)
Potassium: 4.9 mmol/L (ref 3.5–5.2)
Sodium: 142 mmol/L (ref 134–144)
Total Protein: 7 g/dL (ref 6.0–8.5)

## 2017-07-29 LAB — CBC WITH DIFFERENTIAL/PLATELET
Basophils Absolute: 0.1 10*3/uL (ref 0.0–0.2)
Basos: 1 %
EOS (ABSOLUTE): 0.1 10*3/uL (ref 0.0–0.4)
Eos: 3 %
Hematocrit: 42.9 % (ref 34.0–46.6)
Hemoglobin: 14.2 g/dL (ref 11.1–15.9)
IMMATURE GRANULOCYTES: 0 %
Immature Grans (Abs): 0 10*3/uL (ref 0.0–0.1)
LYMPHS ABS: 2 10*3/uL (ref 0.7–3.1)
Lymphs: 42 %
MCH: 28.5 pg (ref 26.6–33.0)
MCHC: 33.1 g/dL (ref 31.5–35.7)
MCV: 86 fL (ref 79–97)
MONOS ABS: 0.3 10*3/uL (ref 0.1–0.9)
Monocytes: 7 %
NEUTROS PCT: 47 %
Neutrophils Absolute: 2.2 10*3/uL (ref 1.4–7.0)
PLATELETS: 262 10*3/uL (ref 150–450)
RBC: 4.98 x10E6/uL (ref 3.77–5.28)
RDW: 13 % (ref 12.3–15.4)
WBC: 4.8 10*3/uL (ref 3.4–10.8)

## 2017-07-29 LAB — LIPID PANEL
CHOLESTEROL TOTAL: 214 mg/dL — AB (ref 100–199)
Chol/HDL Ratio: 4.3 ratio (ref 0.0–4.4)
HDL: 50 mg/dL (ref >39)
LDL CALC: 137 mg/dL — AB (ref 0–99)
TRIGLYCERIDES: 136 mg/dL (ref 0–149)
VLDL Cholesterol Cal: 27 mg/dL (ref 5–40)

## 2017-07-29 LAB — VITAMIN D 25 HYDROXY (VIT D DEFICIENCY, FRACTURES): Vit D, 25-Hydroxy: 29.3 ng/mL — ABNORMAL LOW (ref 30.0–100.0)

## 2017-07-29 LAB — SEDIMENTATION RATE: SED RATE: 2 mm/h (ref 0–40)

## 2017-07-29 LAB — TSH: TSH: 3.07 u[IU]/mL (ref 0.450–4.500)

## 2017-07-30 ENCOUNTER — Ambulatory Visit
Admission: RE | Admit: 2017-07-30 | Discharge: 2017-07-30 | Disposition: A | Discharge status: Home / Self Care | Payer: Managed Care, Other (non HMO) | Source: Ambulatory Visit | Attending: Family Medicine | Admitting: Family Medicine

## 2017-07-30 DIAGNOSIS — M25572 Pain in left ankle and joints of left foot: Secondary | ICD-10-CM

## 2017-07-30 DIAGNOSIS — M25571 Pain in right ankle and joints of right foot: Secondary | ICD-10-CM

## 2017-07-30 DIAGNOSIS — G8929 Other chronic pain: Secondary | ICD-10-CM

## 2017-07-30 DIAGNOSIS — M25551 Pain in right hip: Principal | ICD-10-CM

## 2017-08-05 DIAGNOSIS — M79671 Pain in right foot: Secondary | ICD-10-CM | POA: Insufficient documentation

## 2017-08-11 ENCOUNTER — Ambulatory Visit: Payer: Managed Care, Other (non HMO) | Attending: Sports Medicine | Admitting: Physical Therapy

## 2017-08-11 ENCOUNTER — Encounter: Payer: Self-pay | Admitting: Physical Therapy

## 2017-08-11 ENCOUNTER — Other Ambulatory Visit: Payer: Self-pay

## 2017-08-11 DIAGNOSIS — M6281 Muscle weakness (generalized): Secondary | ICD-10-CM | POA: Diagnosis present

## 2017-08-11 DIAGNOSIS — M25551 Pain in right hip: Secondary | ICD-10-CM | POA: Diagnosis present

## 2017-08-11 DIAGNOSIS — M79671 Pain in right foot: Secondary | ICD-10-CM | POA: Insufficient documentation

## 2017-08-11 DIAGNOSIS — R262 Difficulty in walking, not elsewhere classified: Secondary | ICD-10-CM | POA: Diagnosis present

## 2017-08-11 DIAGNOSIS — R29898 Other symptoms and signs involving the musculoskeletal system: Secondary | ICD-10-CM | POA: Diagnosis present

## 2017-08-11 NOTE — Therapy (Signed)
[facility] [address]  [address] [place], [place], [address] Phone: [phone]   Fax:  [phone]  Physical Therapy Evaluation  Patient Details  Name: Kayla Lewis MRN: [id number] Date of Birth: 1960-06-09 Referring Provider: Rodolph Bong, MD   Encounter Date: 08/11/2017  PT End of Session - 08/11/17 1805    Visit Number  1    Number of Visits  13    Date for PT Re-Evaluation  09/22/17    Authorization Type  [employer]    PT Start Time  1700    PT Stop Time  1752    PT Time Calculation (min)  52 min    Activity Tolerance  Patient tolerated treatment well    Behavior During Therapy  [facility] for tasks assessed/performed       Past Medical History:  Diagnosis Date  . Erosive gastritis   . Hiatal hernia   . History of abnormal Pap smear 1991  . Migraine age 42's   with aura  . Obesity (BMI 30-39.9) 11/05/2016  . Osteoporosis   . Shingles outbreak 06/2008   abdomen    Past Surgical History:  Procedure Laterality Date  . BREAST EXCISIONAL BIOPSY Left   . BREAST SURGERY Left 1997   lumpectomy benign  . CERVICAL POLYPECTOMY  1999  . CERVIX LESION DESTRUCTION    . COLONOSCOPY W/ BIOPSIES  09/22/10   1 polyp benign recheck in 10 years.  . TONSILLECTOMY  age 69    There were no vitals filed for this visit.   Subjective Assessment - 08/11/17 1701    Subjective  Patient reports she has had plantar fasciitis in R foot for a couple of years with fluctuating symptoms. Reports x-rays revealed heel spur in R foot with symptoms progressively getting worse over the last 3 months. Receiving injection every 6 months for osteoporosis- has had 2 injections so far. Reports B ankle joint pain since getting these injections. MD administered walking boot to wear for unspecified amount of time; patient wears at work. Reports she has to ice foot at end of day if doesn't wear boot due to 9/10 pain. Over last 8 months has noticed worsening of  R hip bursitis. Having trouble laying on R side, tender to touch, trouble mowing grass, pain with prolonged walking.  Starts in lateral hip and intermittently radiates to groin with burning sensation.    Pertinent History  shingles, osteoporosis, migraine, hiatal hernia, erosive gastritis, cervical polypectomy, benign breast lumpectomy    Limitations  House hold activities;Walking;Standing;Sitting    How long can you sit comfortably?  10-15 min    How long can you stand comfortably?  30 min    How long can you walk comfortably?  10 min (without boot)    Diagnostic tests  07/30/17 R hip xray: negative; 07/30/17 R ankle xray: No acute bony abnormality.    Patient Stated Goals  dont want feet to hurt    Currently in Pain?  Yes    Pain Score  2     Pain Location  Foot    Pain Orientation  Right    Pain Descriptors / Indicators  Dull    Pain Type  Chronic pain    Aggravating Factors   prolonged walking/standing, foot rotation or turning in/out, driving    Pain Relieving Factors  walking foot, ice, heat    Multiple Pain Sites  Yes    Pain Score  2    Pain  Location  Hip    Pain Orientation  Right    Pain Type  Chronic pain    Aggravating Factors   laying on R side, prolonged walking, mowing lawn, touching it    Pain Relieving Factors  pillow between knees, elevating knees         [facility] PT Assessment - 08/11/17 1717      Assessment   Medical Diagnosis  Pain in R foot    Referring Provider  Rodolph Bong, MD    Onset Date/Surgical Date  -- different for each condition    Next MD Visit  -- not scheduled    Prior Therapy  Yes      Precautions   Precautions  -- osteoporosis      Restrictions   Weight Bearing Restrictions  Yes    RLE Weight Bearing  -- in walking boot throughout the day for unscpecified time      Balance Screen   Has the patient fallen in the past 6 months  No    Has the patient had a decrease in activity level because of a fear of falling?   No    Is the patient  reluctant to leave their home because of a fear of falling?   No      Home Environment   Living Environment  Private residence    Living Arrangements  Alone    Available Help at Discharge  Family;Friend(s);Neighbor    Type of Home  House    Home Access  Stairs to enter    [employer] of Steps  3    Entrance Stairs-Rails  Left;Right    Home Layout  One level    Home Equipment  Crutches      Cognition   Overall Cognitive Status  Within Functional Limits for tasks assessed      Sensation   Light Touch  Appears Intact      Coordination   Gross Motor Movements are Fluid and Coordinated  Yes      Posture/Postural Control   Posture/Postural Control  Postural limitations    Postural Limitations  Rounded Shoulders;Forward head      ROM / Strength   AROM / PROM / Strength  Strength;AROM      AROM   AROM Assessment Site  Ankle;Hip    Right/Left Hip  Right;Left    Right Hip Flexion  100    Right Hip External Rotation   37    Right Hip Internal Rotation   37    Right Hip ABduction  53    Left Hip Flexion  115    Left Hip External Rotation   35    Left Hip Internal Rotation   42    Left Hip ABduction  55    Right/Left Ankle  Right;Left    Right Ankle Dorsiflexion  5    Right Ankle Plantar Flexion  25    Right Ankle Inversion  40    Right Ankle Eversion  10    Left Ankle Dorsiflexion  11    Left Ankle Plantar Flexion  42    Left Ankle Inversion  41    Left Ankle Eversion  30      Strength   Strength Assessment Site  Hip;Knee;Ankle    Right/Left Hip  Right;Left    Right Hip Flexion  4/5    Right Hip ABduction  4/5    Right Hip ADduction  4/5    Left Hip Flexion  4/5    Left Hip ABduction  4/5    Left Hip ADduction  4/5    Right/Left Knee  Right;Left    Right Knee Flexion  4/5    Right Knee Extension  4/5    Left Knee Flexion  4-/5    Left Knee Extension  4/5    Right/Left Ankle  Right;Left    Right Ankle Dorsiflexion  4/5    Right Ankle Plantar Flexion   4/5    Right Ankle Inversion  3+/5    Left Ankle Dorsiflexion  4/5    Left Ankle Plantar Flexion  4/5    Left Ankle Inversion  3+/5    Left Ankle Eversion  4/5      Flexibility   Soft Tissue Assessment /Muscle Length  yes    Hamstrings  B WFL    Quadriceps  B moderate tightness    ITB  R mildly tight      Palpation   Palpation comment  R central calcaneus TTP, mild soft tissue restriction in central plantar fascia; TTP in peroneal and pos tib tendons; TTP R lateral hip and superior glute      Ambulation/Gait   Assistive device  None    Gait Pattern  Step-through pattern;Decreased stance time - right;Decreased hip/knee flexion - right;Decreased weight shift to right    Ambulation Surface  Level;Indoor    Gait velocity  WNL                Objective measurements completed on examination: See above findings.              PT Education - 08/11/17 1755    Education Details  Prognosis, POC, HEP    Person(s) Educated  Patient    Methods  Explanation;Demonstration;Tactile cues;Verbal cues;Handout    Comprehension  Returned demonstration;Verbalized understanding       PT Short Term Goals - 08/11/17 1813      PT SHORT TERM GOAL #1   Title  Patient to be independent with intial HEP.    Time  3    Period  Weeks    Status  New    Target Date  09/01/17        PT Long Term Goals - 08/11/17 1813      PT LONG TERM GOAL #1   Title  Patient to be independent with advanced HEP.    Time  6    Period  Weeks    Status  New    Target Date  09/22/17      PT LONG TERM GOAL #2   Title  Patient to demonstrate >=4+/5 strength in B LEs.    Time  6    Period  Weeks    Status  New    Target Date  09/22/17      PT LONG TERM GOAL #3   Title  Patient to demonstrate R hip and ankle AROM symmetrical to L LE and without pain limiting.     Time  6    Period  Weeks    Status  New    Target Date  09/22/17      PT LONG TERM GOAL #4   Title  Patient to report tolerance of  2 hours of walking without boot without pain in R foot or hip.    Time  6    Period  Weeks    Status  New    Target Date  09/22/17  PT LONG TERM GOAL #5   Title  Patient to report tolerance of R sidelying for 4 hours without pain.    Time  6    Period  Weeks    Status  New    Target Date  09/22/17             Plan - 08/11/17 1805    Clinical Impression Statement  Patient is a 57y/o F presenting to OPPT with c/o R foot and hip pain. Reports longstanding hx of plantar fasciitis with recent worsening and recent x-ray revealing heel spur on R foot. Patient also reporting progressive worsening of R lateral hip pain which intermittently radiates to groin. Patient arrived without walking boot, but reports she wears walking boot throughout the day per MD's orders. Patient with difficulty tolerating prolonged sitting/standing/walking, R sidelying, R ankle ROM. Patient today with limited but not painful R hip and ankle AROM, decreased strength, decreased B quad and R TFL flexibility, and tenderness in R central calcaneus, center of plantar surface of foot, peroneal and posterior tib tendons, R lateral hip, and superior glute. Patient educated on gentle stretching and strengthening HEP and educated on plantar fascia massage. Patient advised not to push into pain with any exercises and received handout. Reported understanding. Would benefit from skulled PT services 2x/week for 6 weeks to address aforementioned impairments.     Clinical Presentation  Stable    Clinical Decision Making  Low    Rehab Potential  Good    Clinical Impairments Affecting Rehab Potential  osteoporosis     PT Frequency  2x / week    PT Duration  6 weeks    PT Treatment/Interventions  ADLs/Self Care Home Management;Cryotherapy;Electrical Stimulation;Iontophoresis 4mg /ml Dexamethasone;Moist Heat;Ultrasound;Gait training;Stair training;Functional mobility training;Therapeutic activities;Therapeutic exercise;Manual  techniques;Patient/family education;Orthotic Fit/Training;Neuromuscular re-education;Balance training;Passive range of motion;Dry needling;Energy conservation;Splinting;Taping;Vasopneumatic Device    PT Next Visit Plan  reassess HEP    Consulted and Agree with Plan of Care  Patient       Patient will benefit from skilled therapeutic intervention in order to improve the following deficits and impairments:  Decreased activity tolerance, Decreased strength, Pain, Difficulty walking, Decreased range of motion, Postural dysfunction, Impaired flexibility  Visit Diagnosis: Pain in right foot  Pain in right hip  Difficulty in walking, not elsewhere classified  Muscle weakness (generalized)  Other symptoms and signs involving the musculoskeletal system     Problem List Patient Active Problem List   Diagnosis Date Noted  . Dyspnea on exertion 11/19/2016  . Abnormal EKG 11/09/2016  . Family history of heart disease in female family member before age 58 11/09/2016  . Obesity (BMI 30-39.9) 11/05/2016  . Migraine headache   . Dyslipidemia 06/25/2016  . Epigastric pain 07/09/2015  . Heartburn 07/09/2015  . Nausea 07/09/2015  . Osteoporosis 10/25/2014  . Fatigue 08/10/2014  . Allergic rhinitis 11/08/2013  . Functional dyspepsia 11/08/2013  . Gastric irritation 11/08/2013  . Pain of right hip joint 11/08/2013  . Postmenopausal disorder 11/08/2013  . Rosacea 11/08/2013    Anette GuarneriYevgeniya Marcellene Shivley, PT, DPT 08/11/17 6:18 PM   [facility] [place] [address]  [address] [place], [place], [address] Phone: [phone]   Fax:  [phone]  Name: Kayla Lewis MRN: [id number] Date of Birth: 25-May-1960

## 2017-08-16 ENCOUNTER — Ambulatory Visit: Payer: Managed Care, Other (non HMO)

## 2017-08-16 DIAGNOSIS — M6281 Muscle weakness (generalized): Secondary | ICD-10-CM

## 2017-08-16 DIAGNOSIS — M79671 Pain in right foot: Secondary | ICD-10-CM

## 2017-08-16 DIAGNOSIS — M25551 Pain in right hip: Secondary | ICD-10-CM

## 2017-08-16 DIAGNOSIS — R29898 Other symptoms and signs involving the musculoskeletal system: Secondary | ICD-10-CM

## 2017-08-16 DIAGNOSIS — R262 Difficulty in walking, not elsewhere classified: Secondary | ICD-10-CM

## 2017-08-16 NOTE — Therapy (Signed)
[facility] Outpatient Rehabilitation [facility] [address]  [address] [place], [place], [address] Phone: [phone]   Fax:  [phone]  Physical Therapy Treatment  Patient Details  Name: Kayla Lewis MRN: [id number] Date of Birth: 1960-05-14 Referring Provider: Rodolph Bong, MD   Encounter Date: 08/16/2017  PT End of Session - 08/16/17 1628    Visit Number  2    Number of Visits  13    Date for PT Re-Evaluation  09/22/17    Authorization Type  [employer]    PT Start Time  1620    PT Stop Time  1700    PT Time Calculation (min)  40 min    Activity Tolerance  Patient tolerated treatment well    Behavior During Therapy  [facility] for tasks assessed/performed       Past Medical History:  Diagnosis Date  . Erosive gastritis   . Hiatal hernia   . History of abnormal Pap smear 1991  . Migraine age 64's   with aura  . Obesity (BMI 30-39.9) 11/05/2016  . Osteoporosis   . Shingles outbreak 06/2008   abdomen    Past Surgical History:  Procedure Laterality Date  . BREAST EXCISIONAL BIOPSY Left   . BREAST SURGERY Left 1997   lumpectomy benign  . CERVICAL POLYPECTOMY  1999  . CERVIX LESION DESTRUCTION    . COLONOSCOPY W/ BIOPSIES  09/22/10   1 polyp benign recheck in 10 years.  . TONSILLECTOMY  age 86    There were no vitals filed for this visit.  Subjective Assessment - 08/16/17 1627    Subjective  Reports her R hip pain is primary concern today.      Diagnostic tests  07/30/17 R hip xray: negative; 07/30/17 R ankle xray: No acute bony abnormality.    Patient Stated Goals  dont want feet to hurt    Currently in Pain?  Yes    Pain Score  2  up to 5/10 with walking and standing     Pain Location  Hip    Pain Orientation  Right    Pain Descriptors / Indicators  Nagging    Pain Type  Chronic pain    Aggravating Factors   prolonged walking and standing,    Multiple Pain Sites  No                       [facility] Adult PT Treatment/Exercise -  08/16/17 0001      Knee/Hip Exercises: Stretches   Passive Hamstring Stretch  Right;30 seconds    Passive Hamstring Stretch Limitations  with strap    Quad Stretch  Right;2 reps;30 seconds    Quad Stretch Limitations  prone with strap     ITB Stretch  Right;2 reps;30 seconds    ITB Stretch Limitations  supine with strap     Piriformis Stretch  Right;2 reps;30 seconds    Piriformis Stretch Limitations  KTOS, Figure-4      Knee/Hip Exercises: Supine   Other Supine Knee/Hip Exercises  Hooklying alternating hip abd/ER with red TB at knees x 10 reps each way       Knee/Hip Exercises: Sidelying   Clams  R x 15 reps       Modalities   Modalities  Iontophoresis      Iontophoresis   Type of Iontophoresis  Dexamethasone    Location  R GT     Dose  1.19mL, 81mA-min    Time  4-6Hr wear time  patch 1#/6      Manual Therapy   Manual Therapy  Passive ROM    Passive ROM  R manual ITB stretch with therapist                PT Short Term Goals - 08/16/17 1633      PT SHORT TERM GOAL #1   Title  Patient to be independent with intial HEP.    Time  3    Period  Weeks    Status  On-going        PT Long Term Goals - 08/16/17 1633      PT LONG TERM GOAL #1   Title  Patient to be independent with advanced HEP.    Time  6    Period  Weeks    Status  On-going      PT LONG TERM GOAL #2   Title  Patient to demonstrate >=4+/5 strength in B LEs.    Time  6    Period  Weeks    Status  On-going      PT LONG TERM GOAL #3   Title  Patient to demonstrate R hip and ankle AROM symmetrical to L LE and without pain limiting.     Time  6    Period  Weeks    Status  On-going      PT LONG TERM GOAL #4   Title  Patient to report tolerance of 2 hours of walking without boot without pain in R foot or hip.    Time  6    Period  Weeks    Status  On-going      PT LONG TERM GOAL #5   Title  Patient to report tolerance of R sidelying for 4 hours without pain.    Time  6    Period  Weeks     Status  On-going            Plan - 08/16/17 1634    Clinical Impression Statement  Kayla Lewis reporting her primary concern today is R hip pain and has had good relief from R foot pain since wearing boot all day.  Tolerated gentle lumbopelvic strengthening with and proximal hip flexibility activities in session today well.  Initiated iontophoresis today as pt. point tender at R GT with pt. issued educational handout as MD has signed order for this.  Will monitor response to iontophoresis in coming visit.      PT Treatment/Interventions  ADLs/Self Care Home Management;Cryotherapy;Electrical Stimulation;Iontophoresis 4mg /ml Dexamethasone;Moist Heat;Ultrasound;Gait training;Stair training;Functional mobility training;Therapeutic activities;Therapeutic exercise;Manual techniques;Patient/family education;Orthotic Fit/Training;Neuromuscular re-education;Balance training;Passive range of motion;Dry needling;Energy conservation;Splinting;Taping;Vasopneumatic Device    Consulted and Agree with Plan of Care  Patient       Patient will benefit from skilled therapeutic intervention in order to improve the following deficits and impairments:  Decreased activity tolerance, Decreased strength, Pain, Difficulty walking, Decreased range of motion, Postural dysfunction, Impaired flexibility  Visit Diagnosis: Pain in right foot  Pain in right hip  Difficulty in walking, not elsewhere classified  Muscle weakness (generalized)  Other symptoms and signs involving the musculoskeletal system     Problem List Patient Active Problem List   Diagnosis Date Noted  . Dyspnea on exertion 11/19/2016  . Abnormal EKG 11/09/2016  . Family history of heart disease in female family member before age 32 11/09/2016  . Obesity (BMI 30-39.9) 11/05/2016  . Migraine headache   . Dyslipidemia 06/25/2016  . Epigastric pain 07/09/2015  .  Heartburn 07/09/2015  . Nausea 07/09/2015  . Osteoporosis 10/25/2014  . Fatigue  08/10/2014  . Allergic rhinitis 11/08/2013  . Functional dyspepsia 11/08/2013  . Gastric irritation 11/08/2013  . Pain of right hip joint 11/08/2013  . Postmenopausal disorder 11/08/2013  . Rosacea 11/08/2013    Kermit BaloMicah Vicktoria Muckey, PTA 08/16/17 6:02 PM   [facility] [place] [address]  [address] [place], [place], [address] Phone: [phone]   Fax:  [phone]  Name: [name] MRN: [id number] Date of Birth: Aug 30, 1960

## 2017-08-16 NOTE — Patient Instructions (Signed)

## 2017-08-23 ENCOUNTER — Ambulatory Visit: Payer: Managed Care, Other (non HMO)

## 2017-08-23 DIAGNOSIS — R29898 Other symptoms and signs involving the musculoskeletal system: Secondary | ICD-10-CM

## 2017-08-23 DIAGNOSIS — M6281 Muscle weakness (generalized): Secondary | ICD-10-CM

## 2017-08-23 DIAGNOSIS — M79671 Pain in right foot: Secondary | ICD-10-CM | POA: Diagnosis not present

## 2017-08-23 DIAGNOSIS — R262 Difficulty in walking, not elsewhere classified: Secondary | ICD-10-CM

## 2017-08-23 DIAGNOSIS — M25551 Pain in right hip: Secondary | ICD-10-CM

## 2017-08-23 NOTE — Therapy (Signed)
[facility] [address]  [address] [place], [place], [address] Phone: [phone]   Fax:  [phone]  Physical Therapy Treatment  Patient Details  Name: Kayla SinningLinda H Lewis MRN: [id number] Date of Birth: May 28, 1960 Referring Provider: Rodolph BongAdam Kendall, MD   Encounter Date: 08/23/2017  PT End of Session - 08/23/17 1629    Visit Number  3    Number of Visits  13    Date for PT Re-Evaluation  09/22/17    Authorization Type  [employer]    PT Start Time  1620    PT Stop Time  1702    PT Time Calculation (min)  42 min    Activity Tolerance  Patient tolerated treatment well    Behavior During Therapy  [facility] for tasks assessed/performed       Past Medical History:  Diagnosis Date  . Erosive gastritis   . Hiatal hernia   . History of abnormal Pap smear 1991  . Migraine age 57's   with aura  . Obesity (BMI 30-39.9) 11/05/2016  . Osteoporosis   . Shingles outbreak 06/2008   abdomen    Past Surgical History:  Procedure Laterality Date  . BREAST EXCISIONAL BIOPSY Left   . BREAST SURGERY Left 1997   lumpectomy benign  . CERVICAL POLYPECTOMY  1999  . CERVIX LESION DESTRUCTION    . COLONOSCOPY W/ BIOPSIES  09/22/10   1 polyp benign recheck in 10 years.  . TONSILLECTOMY  age 89    There were no vitals filed for this visit.  Subjective Assessment - 08/23/17 1626    Subjective  Pt. noting the R foot and R hip pain is her primary concern today and wishes to split time in today's session between both of these.      Pertinent History  shingles, osteoporosis, migraine, hiatal hernia, erosive gastritis, cervical polypectomy, benign breast lumpectomy    Diagnostic tests  07/30/17 R hip xray: negative; 07/30/17 R ankle xray: No acute bony abnormality.    Patient Stated Goals  dont want feet to hurt    Currently in Pain?  Yes    Pain Score  4     Pain Location  Hip    Pain Orientation  Right    Pain Descriptors / Indicators  Nagging "twinge"     Pain Type  Chronic pain    Aggravating Factors   prolonged sitting     Pain Relieving Factors  standing     Pain Location  Foot    Pain Orientation  Right    Pain Descriptors / Indicators  Aching "frustrating"     Pain Type  Chronic pain    Pain Onset  More than a month ago    Pain Frequency  Constant    Aggravating Factors   washing car on Saturday    Pain Relieving Factors  rolling with water bottle                        [facility] Adult PT Treatment/Exercise - 08/23/17 1646      Knee/Hip Exercises: Stretches   Piriformis Stretch  Right;2 reps;30 seconds    Gastroc Stretch  Right;30 seconds;2 reps    Gastroc Stretch Limitations  runner stretch into wall, calf + great toe extension stretch on wall       Knee/Hip Exercises: Aerobic   Nustep  Lvl 5, 7 min       Knee/Hip Exercises: Supine  Bridges  Both;15 reps 5" hold       Knee/Hip Exercises: Sidelying   Hip ABduction  Right;10 reps    Clams  R x 15 reps yellow TB       Modalities   Modalities  Iontophoresis      Iontophoresis   Type of Iontophoresis  Dexamethasone    Location  R GT     Dose  1.85mL, 78mA-min    Time  4-6Hr wear time  2#/6      Manual Therapy   Manual Therapy  Passive ROM;Soft tissue mobilization    Soft tissue mobilization  STM/strumming to R arch and heel in gastroc stretch position     Passive ROM  R manual ITB, piriformis, quad, TFL stretch with therapist                PT Short Term Goals - 08/16/17 1633      PT SHORT TERM GOAL #1   Title  Patient to be independent with intial HEP.    Time  3    Period  Weeks    Status  On-going        PT Long Term Goals - 08/16/17 1633      PT LONG TERM GOAL #1   Title  Patient to be independent with advanced HEP.    Time  6    Period  Weeks    Status  On-going      PT LONG TERM GOAL #2   Title  Patient to demonstrate >=4+/5 strength in B LEs.    Time  6    Period  Weeks    Status  On-going      PT LONG TERM GOAL #3    Title  Patient to demonstrate R hip and ankle AROM symmetrical to L LE and without pain limiting.     Time  6    Period  Weeks    Status  On-going      PT LONG TERM GOAL #4   Title  Patient to report tolerance of 2 hours of walking without boot without pain in R foot or hip.    Time  6    Period  Weeks    Status  On-going      PT LONG TERM GOAL #5   Title  Patient to report tolerance of R sidelying for 4 hours without pain.    Time  6    Period  Weeks    Status  On-going            Plan - 08/23/17 1643    Clinical Impression Statement  Kindsey noting good relief x 4 days following last visit and attributes this to the ionto patch applied to R hip.  Did feel that washing car on Saturday flared up R heel and hip.  Tolerated progression of lumbopelvic strengthening activities well today.  Manual therapy with STM/strumming to R arch with good tolerance.  Ended session with pt. requesting ionto patch thus applied ionto patch #2/6 to R hip to end session.      Clinical Impairments Affecting Rehab Potential  osteoporosis     PT Treatment/Interventions  ADLs/Self Care Home Management;Cryotherapy;Electrical Stimulation;Iontophoresis 4mg /ml Dexamethasone;Moist Heat;Ultrasound;Gait training;Stair training;Functional mobility training;Therapeutic activities;Therapeutic exercise;Manual techniques;Patient/family education;Orthotic Fit/Training;Neuromuscular re-education;Balance training;Passive range of motion;Dry needling;Energy conservation;Splinting;Taping;Vasopneumatic Device    Consulted and Agree with Plan of Care  Patient       Patient will benefit from skilled therapeutic intervention in order to improve the  following deficits and impairments:  Decreased activity tolerance, Decreased strength, Pain, Difficulty walking, Decreased range of motion, Postural dysfunction, Impaired flexibility  Visit Diagnosis: Pain in right foot  Pain in right hip  Difficulty in walking, not elsewhere  classified  Muscle weakness (generalized)  Other symptoms and signs involving the musculoskeletal system     Problem List Patient Active Problem List   Diagnosis Date Noted  . Dyspnea on exertion 11/19/2016  . Abnormal EKG 11/09/2016  . Family history of heart disease in female family member before age 27 11/09/2016  . Obesity (BMI 30-39.9) 11/05/2016  . Migraine headache   . Dyslipidemia 06/25/2016  . Epigastric pain 07/09/2015  . Heartburn 07/09/2015  . Nausea 07/09/2015  . Osteoporosis 10/25/2014  . Fatigue 08/10/2014  . Allergic rhinitis 11/08/2013  . Functional dyspepsia 11/08/2013  . Gastric irritation 11/08/2013  . Pain of right hip joint 11/08/2013  . Postmenopausal disorder 11/08/2013  . Rosacea 11/08/2013    Kermit Balo, PTA 08/23/17 5:21 PM   [facility] [address]  [address] [place], [place], [address] Phone: [phone]   Fax:  [phone]  Name: POOJA CAMUSO MRN: [id number] Date of Birth: 05/23/1960

## 2017-08-25 ENCOUNTER — Ambulatory Visit: Payer: Managed Care, Other (non HMO) | Admitting: Physical Therapy

## 2017-08-25 ENCOUNTER — Encounter: Payer: Self-pay | Admitting: Physical Therapy

## 2017-08-25 DIAGNOSIS — M79671 Pain in right foot: Secondary | ICD-10-CM

## 2017-08-25 DIAGNOSIS — R29898 Other symptoms and signs involving the musculoskeletal system: Secondary | ICD-10-CM

## 2017-08-25 DIAGNOSIS — M25551 Pain in right hip: Secondary | ICD-10-CM

## 2017-08-25 DIAGNOSIS — R262 Difficulty in walking, not elsewhere classified: Secondary | ICD-10-CM

## 2017-08-25 DIAGNOSIS — M6281 Muscle weakness (generalized): Secondary | ICD-10-CM

## 2017-08-25 NOTE — Therapy (Signed)
[facility] [address]  [address] [place], [place], [address] Phone: [phone]   Fax:  [phone]  Physical Therapy Treatment  Patient Details  Name: Kayla Lewis MRN: [id number] Date of Birth: 02-05-1960 Referring Provider: Rodolph Bong, MD   Encounter Date: 08/25/2017  PT End of Session - 08/25/17 1810    Visit Number  4    Number of Visits  13    Date for PT Re-Evaluation  09/22/17    Authorization Type  [employer]    PT Start Time  1617    PT Stop Time  1659    PT Time Calculation (min)  42 min    Activity Tolerance  Patient tolerated treatment well    Behavior During Therapy  [facility] for tasks assessed/performed       Past Medical History:  Diagnosis Date  . Erosive gastritis   . Hiatal hernia   . History of abnormal Pap smear 1991  . Migraine age 55's   with aura  . Obesity (BMI 30-39.9) 11/05/2016  . Osteoporosis   . Shingles outbreak 06/2008   abdomen    Past Surgical History:  Procedure Laterality Date  . BREAST EXCISIONAL BIOPSY Left   . BREAST SURGERY Left 1997   lumpectomy benign  . CERVICAL POLYPECTOMY  1999  . CERVIX LESION DESTRUCTION    . COLONOSCOPY W/ BIOPSIES  09/22/10   1 polyp benign recheck in 10 years.  . TONSILLECTOMY  age 20    There were no vitals filed for this visit.  Subjective Assessment - 08/25/17 1618    Subjective  Reports foot has been bothering her more recently however both foot and hip are feeling relatively good today. Has been working on hip and foot exercises with Systems analyst.    Pertinent History  shingles, osteoporosis, migraine, hiatal hernia, erosive gastritis, cervical polypectomy, benign breast lumpectomy    Diagnostic tests  07/30/17 R hip xray: negative; 07/30/17 R ankle xray: No acute bony abnormality.    Patient Stated Goals  dont want feet to hurt    Currently in Pain?  Yes    Pain Score  0-No pain    Pain Location  Hip    Pain Orientation  Right     Pain Descriptors / Indicators  Nagging    Pain Type  Chronic pain    Pain Score  3    Pain Location  Heel    Pain Orientation  Right    Pain Descriptors / Indicators  Aching    Pain Type  Chronic pain                       [facility] Adult PT Treatment/Exercise - 08/25/17 0001      Knee/Hip Exercises: Stretches   Lobbyist  Right;2 reps;30 seconds;Limitations    Lobbyist Limitations  prone with strap and LE elevated on bolster      Knee/Hip Exercises: Aerobic   Nustep  L4 x 6 min      Knee/Hip Exercises: Seated   Other Seated Knee/Hip Exercises  R towel toe crunches; 20x    Other Seated Knee/Hip Exercises  B hip IR with red TB; 15x      Knee/Hip Exercises: Sidelying   Hip ABduction  Right;Strengthening;1 set;15 reps;Limitations    Hip ABduction Limitations  VC/TCs for alignment    Hip ADduction  Strengthening;Right;1 set;15 reps;Limitations    Hip ADduction Limitations  TCs for alignment  Clams  R x 15 reps yellow TB       Knee/Hip Exercises: Prone   Other Prone Exercises  prone donkey kick + ABD; 10x each LE VC/TCs to avoid rotating spine      Manual Therapy   Manual Therapy  Passive ROM;Soft tissue mobilization;Taping    Soft tissue mobilization  R lateral heel and length of plantar fascia STM- increased restriction on medial plantar surface     Passive ROM  passive R DF + toe extension stretch 3x30 sec    Kinesiotex  IT consultantCreate Space      Kinesiotix   Create Space  R foot plantarfasciitis + supination strap             PT Education - 08/25/17 1817    Education Details  Reviewed kinesiotape wear time, precautions, and removal with patient. Reported understanding.     Person(s) Educated  Patient    Methods  Explanation    Comprehension  Verbalized understanding       PT Short Term Goals - 08/16/17 1633      PT SHORT TERM GOAL #1   Title  Patient to be independent with intial HEP.    Time  3    Period  Weeks    Status  On-going         PT Long Term Goals - 08/16/17 1633      PT LONG TERM GOAL #1   Title  Patient to be independent with advanced HEP.    Time  6    Period  Weeks    Status  On-going      PT LONG TERM GOAL #2   Title  Patient to demonstrate >=4+/5 strength in B LEs.    Time  6    Period  Weeks    Status  On-going      PT LONG TERM GOAL #3   Title  Patient to demonstrate R hip and ankle AROM symmetrical to L LE and without pain limiting.     Time  6    Period  Weeks    Status  On-going      PT LONG TERM GOAL #4   Title  Patient to report tolerance of 2 hours of walking without boot without pain in R foot or hip.    Time  6    Period  Weeks    Status  On-going      PT LONG TERM GOAL #5   Title  Patient to report tolerance of R sidelying for 4 hours without pain.    Time  6    Period  Weeks    Status  On-going            Plan - 08/25/17 1811    Clinical Impression Statement  Patient arrived to session with no new complaints. Reports improvement in R hip and foot pain today, able to walk without walking boot all day today. Tolerated STM to R lateral heel and length of plantar fascia- increased restriction on medial plantar surface. Also tolerated passive R DF and toe extension stretch- patient demonstrating limited great toe extension. No c/o hip pain with hip strengthening ther-ex today. Intermittent cues required to correct form. Patient reports doing quad stretch incorrectly- adjusted patient's form with patient reporting understanding this time. Received kinesiotape to R foot for hopeful continued relief. Reviewed wear time, precautions, and removal with patient. Reported understanding.     Clinical Impairments Affecting Rehab Potential  osteoporosis  PT Treatment/Interventions  ADLs/Self Care Home Management;Cryotherapy;Electrical Stimulation;Iontophoresis 4mg /ml Dexamethasone;Moist Heat;Ultrasound;Gait training;Stair training;Functional mobility training;Therapeutic  activities;Therapeutic exercise;Manual techniques;Patient/family education;Orthotic Fit/Training;Neuromuscular re-education;Balance training;Passive range of motion;Dry needling;Energy conservation;Splinting;Taping;Vasopneumatic Device    PT Next Visit Plan  assess response to K tape    Consulted and Agree with Plan of Care  Patient       Patient will benefit from skilled therapeutic intervention in order to improve the following deficits and impairments:  Decreased activity tolerance, Decreased strength, Pain, Difficulty walking, Decreased range of motion, Postural dysfunction, Impaired flexibility  Visit Diagnosis: Pain in right foot  Pain in right hip  Difficulty in walking, not elsewhere classified  Muscle weakness (generalized)  Other symptoms and signs involving the musculoskeletal system     Problem List Patient Active Problem List   Diagnosis Date Noted  . Dyspnea on exertion 11/19/2016  . Abnormal EKG 11/09/2016  . Family history of heart disease in female family member before age 23 11/09/2016  . Obesity (BMI 30-39.9) 11/05/2016  . Migraine headache   . Dyslipidemia 06/25/2016  . Epigastric pain 07/09/2015  . Heartburn 07/09/2015  . Nausea 07/09/2015  . Osteoporosis 10/25/2014  . Fatigue 08/10/2014  . Allergic rhinitis 11/08/2013  . Functional dyspepsia 11/08/2013  . Gastric irritation 11/08/2013  . Pain of right hip joint 11/08/2013  . Postmenopausal disorder 11/08/2013  . Rosacea 11/08/2013    Anette Guarneri, PT, DPT 08/25/17 6:17 PM   [facility] [address] [facility] [address]  [address] [place], [place], [address] Phone: [phone]   Fax:  [phone]  Name: MYLEY BAHNER MRN: [id number] Date of Birth: 13-Apr-1960

## 2017-08-28 ENCOUNTER — Encounter: Payer: Self-pay | Admitting: Family Medicine

## 2017-08-30 ENCOUNTER — Ambulatory Visit: Payer: Managed Care, Other (non HMO) | Attending: Sports Medicine

## 2017-08-30 DIAGNOSIS — M25551 Pain in right hip: Secondary | ICD-10-CM | POA: Diagnosis present

## 2017-08-30 DIAGNOSIS — M6281 Muscle weakness (generalized): Secondary | ICD-10-CM | POA: Diagnosis present

## 2017-08-30 DIAGNOSIS — R262 Difficulty in walking, not elsewhere classified: Secondary | ICD-10-CM

## 2017-08-30 DIAGNOSIS — M79671 Pain in right foot: Secondary | ICD-10-CM | POA: Diagnosis not present

## 2017-08-30 DIAGNOSIS — R29898 Other symptoms and signs involving the musculoskeletal system: Secondary | ICD-10-CM | POA: Insufficient documentation

## 2017-08-30 NOTE — Therapy (Addendum)
[facility] [address]  [address] [place], [place], [address] Phone: [phone]   Fax:  [phone]  Physical Therapy Treatment  Patient Details  Name: Kayla Lewis MRN: [id number] Date of Birth: 07/28/1960 Referring Provider: Rodolph Bong, MD   Encounter Date: 08/30/2017  PT End of Session - 08/30/17 1655    Visit Number  5    Number of Visits  13    Date for PT Re-Evaluation  09/22/17    Authorization Type  [employer]    PT Start Time  1616    PT Stop Time  1658    PT Time Calculation (min)  42 min    Activity Tolerance  Patient tolerated treatment well    Behavior During Therapy  [facility] for tasks assessed/performed       Past Medical History:  Diagnosis Date  . Erosive gastritis   . Hiatal hernia   . History of abnormal Pap smear 1991  . Migraine age 40's   with aura  . Obesity (BMI 30-39.9) 11/05/2016  . Osteoporosis   . Shingles outbreak 06/2008   abdomen    Past Surgical History:  Procedure Laterality Date  . BREAST EXCISIONAL BIOPSY Left   . BREAST SURGERY Left 1997   lumpectomy benign  . CERVICAL POLYPECTOMY  1999  . CERVIX LESION DESTRUCTION    . COLONOSCOPY W/ BIOPSIES  09/22/10   1 polyp benign recheck in 10 years.  . TONSILLECTOMY  age 24    There were no vitals filed for this visit.  Subjective Assessment - 08/30/17 1621    Subjective  Pt. reporting benefit from R ankle taping however removed taping after two days.  Did feel R hip pain flare-up after using riding law mower over weekend.      Pertinent History  shingles, osteoporosis, migraine, hiatal hernia, erosive gastritis, cervical polypectomy, benign breast lumpectomy    Diagnostic tests  07/30/17 R hip xray: negative; 07/30/17 R ankle xray: No acute bony abnormality.    Patient Stated Goals  dont want feet to hurt    Currently in Pain?  Yes    Pain Score  2     Pain Location  Hip    Pain Orientation  Right    Pain Descriptors / Indicators   Nagging    Pain Type  Chronic pain    Pain Onset  More than a month ago    Pain Frequency  Constant    Aggravating Factors   mowing yard     Multiple Pain Sites  No                       [facility] Adult PT Treatment/Exercise - 08/30/17 1643      Knee/Hip Exercises: Stretches   ITB Stretch  Right;2 reps;30 seconds    ITB Stretch Limitations  L sidelying R LE lowered off table       Knee/Hip Exercises: Aerobic   Recumbent Bike  Lvl 2, 7 min       Knee/Hip Exercises: Standing   Other Standing Knee Exercises  Standing side stepping with red TB at forefoot 2 x 30 ft       Knee/Hip Exercises: Supine   Bridges with Clamshell  Both;15 reps;Strengthening with sustained hip abd/ER into green TB       Knee/Hip Exercises: Sidelying   Clams  R clam shell green x 10 reps       Knee/Hip Exercises: Prone  Other Prone Exercises  Alternating "fire hydrant" x 10 reps     Other Prone Exercises  Alternating "Bird Dog" x 10 reps each way       Manual Therapy   Manual Therapy  Taping    Kinesiotex  Create Space      Kinesiotix   Create Space  R foot plantarfasciitis              PT Education - 08/30/17 1819    Education Details  HEP update     Person(s) Educated  Patient    Methods  Explanation;Demonstration;Verbal cues;Handout    Comprehension  Verbalized understanding;Returned demonstration;Verbal cues required;Need further instruction       PT Short Term Goals - 08/30/17 1800      PT SHORT TERM GOAL #1   Title  Patient to be independent with intial HEP.    Time  3    Period  Weeks    Status  Achieved        PT Long Term Goals - 08/16/17 1633      PT LONG TERM GOAL #1   Title  Patient to be independent with advanced HEP.    Time  6    Period  Weeks    Status  On-going      PT LONG TERM GOAL #2   Title  Patient to demonstrate >=4+/5 strength in B LEs.    Time  6    Period  Weeks    Status  On-going      PT LONG TERM GOAL #3   Title  Patient to  demonstrate R hip and ankle AROM symmetrical to L LE and without pain limiting.     Time  6    Period  Weeks    Status  On-going      PT LONG TERM GOAL #4   Title  Patient to report tolerance of 2 hours of walking without boot without pain in R foot or hip.    Time  6    Period  Weeks    Status  On-going      PT LONG TERM GOAL #5   Title  Patient to report tolerance of R sidelying for 4 hours without pain.    Time  6    Period  Weeks    Status  On-going            Plan - 08/30/17 1758    Clinical Impression Statement  [name] reporting some benefit from R foot/ankle taping however no longer intact thus re-taped today.  Remainder of session focused on R hip strengthening and ROM activities as pt. noting R hip pain as primary concern today following mowing yard.  Tolerated all proximal hip strengthening activities well today.  Will continue to progress toward goals.      Clinical Impairments Affecting Rehab Potential  osteoporosis     PT Treatment/Interventions  ADLs/Self Care Home Management;Cryotherapy;Electrical Stimulation;Iontophoresis 4mg /ml Dexamethasone;Moist Heat;Ultrasound;Gait training;Stair training;Functional mobility training;Therapeutic activities;Therapeutic exercise;Manual techniques;Patient/family education;Orthotic Fit/Training;Neuromuscular re-education;Balance training;Passive range of motion;Dry needling;Energy conservation;Splinting;Taping;Vasopneumatic Device    Consulted and Agree with Plan of Care  Patient       Patient will benefit from skilled therapeutic intervention in order to improve the following deficits and impairments:  Decreased activity tolerance, Decreased strength, Pain, Difficulty walking, Decreased range of motion, Postural dysfunction, Impaired flexibility  Visit Diagnosis: Pain in right foot  Pain in right hip  Difficulty in walking, not elsewhere classified  Muscle weakness (generalized)  Other symptoms and signs involving the  musculoskeletal system     Problem List Patient Active Problem List   Diagnosis Date Noted  . Dyspnea on exertion 11/19/2016  . Abnormal EKG 11/09/2016  . Family history of heart disease in female family member before age 30 11/09/2016  . Obesity (BMI 30-39.9) 11/05/2016  . Migraine headache   . Dyslipidemia 06/25/2016  . Epigastric pain 07/09/2015  . Heartburn 07/09/2015  . Nausea 07/09/2015  . Osteoporosis 10/25/2014  . Fatigue 08/10/2014  . Allergic rhinitis 11/08/2013  . Functional dyspepsia 11/08/2013  . Gastric irritation 11/08/2013  . Pain of right hip joint 11/08/2013  . Postmenopausal disorder 11/08/2013  . Rosacea 11/08/2013    Kermit Balo, PTA 08/30/17 6:20 PM   [facility] [address]  [address] [place], [place], [address] Phone: [phone]   Fax:  [phone]  Name: SIERRAH LUEVANO MRN: [id number] Date of Birth: March 04, 1960

## 2017-09-01 ENCOUNTER — Encounter: Payer: Self-pay | Admitting: Physical Therapy

## 2017-09-01 ENCOUNTER — Ambulatory Visit: Payer: Managed Care, Other (non HMO) | Admitting: Physical Therapy

## 2017-09-01 DIAGNOSIS — M6281 Muscle weakness (generalized): Secondary | ICD-10-CM

## 2017-09-01 DIAGNOSIS — R262 Difficulty in walking, not elsewhere classified: Secondary | ICD-10-CM

## 2017-09-01 DIAGNOSIS — M79671 Pain in right foot: Secondary | ICD-10-CM

## 2017-09-01 DIAGNOSIS — R29898 Other symptoms and signs involving the musculoskeletal system: Secondary | ICD-10-CM

## 2017-09-01 DIAGNOSIS — M25551 Pain in right hip: Secondary | ICD-10-CM

## 2017-09-01 NOTE — Therapy (Signed)
[facility] [address]  [address] [place], [place], [address] Phone: [phone]   Fax:  [phone]  Physical Therapy Treatment  Patient Details  Name: Kayla Lewis MRN: [id number] Date of Birth: 04-29-1960 Referring Provider: Rodolph BongAdam Kendall, MD   Encounter Date: 09/01/2017  PT End of Session - 09/01/17 1747    Visit Number  6    Number of Visits  13    Date for PT Re-Evaluation  09/22/17    Authorization Type  [employer]ign[employer]    PT Start Time  1615    PT Stop Time  1701    PT Time Calculation (min)  46 min    Activity Tolerance  Patient tolerated treatment well    Behavior During Therapy  [facility] for tasks assessed/performed       Past Medical History:  Diagnosis Date  . Erosive gastritis   . Hiatal hernia   . History of abnormal Pap smear 1991  . Migraine age 57's   with aura  . Obesity (BMI 30-39.9) 11/05/2016  . Osteoporosis   . Shingles outbreak 06/2008   abdomen    Past Surgical History:  Procedure Laterality Date  . BREAST EXCISIONAL BIOPSY Left   . BREAST SURGERY Left 1997   lumpectomy benign  . CERVICAL POLYPECTOMY  1999  . CERVIX LESION DESTRUCTION    . COLONOSCOPY W/ BIOPSIES  09/22/10   1 polyp benign recheck in 10 years.  . TONSILLECTOMY  age 57    There were no vitals filed for this visit.  Subjective Assessment - 09/01/17 1615    Subjective  Reports hip is doing well. Heel is pulling as she walked on it more today and did not wear boot today. Surprised at how much taping helped.     Pertinent History  shingles, osteoporosis, migraine, hiatal hernia, erosive gastritis, cervical polypectomy, benign breast lumpectomy    Diagnostic tests  07/30/17 R hip xray: negative; 07/30/17 R ankle xray: No acute bony abnormality.    Patient Stated Goals  dont want feet to hurt    Currently in Pain?  Yes    Pain Score  1     Pain Location  Hip    Pain Orientation  Right    Pain Descriptors / Indicators  Nagging    Pain Type  Chronic pain    Pain Score  3    Pain Location  Heel    Pain Orientation  Right    Pain Descriptors / Indicators  Aching    Pain Type  Chronic pain                       [facility] Adult PT Treatment/Exercise - 09/01/17 0001      Knee/Hip Exercises: Aerobic   Nustep  L3 x 6min      Knee/Hip Exercises: Standing   Hip Abduction  Stengthening;Right;Left;1 set;10 reps;Knee straight;Limitations    Abduction Limitations  red TB around toes      Knee/Hip Exercises: Seated   Other Seated Knee/Hip Exercises  R great toe extension stretch against dumbbell 10x3"; with heel raise 10x    Other Seated Knee/Hip Exercises  R great toe flexion + other digit extension x15      Knee/Hip Exercises: Sidelying   Clams  B clamshell with R TB + 3 pulse 10x      Knee/Hip Exercises: Prone   Hip Extension  Strengthening;Left;Right;1 set;10 reps;Limitations    Hip Extension Limitations  donkey kick + ABD    Other Prone Exercises  Alternating "Bird Dog" x 10 reps each way       Manual Therapy   Manual Therapy  Soft tissue mobilization;Taping    Soft tissue mobilization  Along plantar fascia of R foot, R gastroc- increased tenderness along medial gastroc and TTP    Kinesiotex  IT consultant  R foot plantarfasciitis              PT Education - 09/01/17 1747    Education Details  HEP update    Person(s) Educated  Patient    Methods  Explanation;Demonstration;Tactile cues;Verbal cues;Handout    Comprehension  Verbalized understanding;Returned demonstration       PT Short Term Goals - 08/30/17 1800      PT SHORT TERM GOAL #1   Title  Patient to be independent with intial HEP.    Time  3    Period  Weeks    Status  Achieved        PT Long Term Goals - 08/16/17 1633      PT LONG TERM GOAL #1   Title  Patient to be independent with advanced HEP.    Time  6    Period  Weeks    Status  On-going      PT LONG TERM GOAL #2   Title   Patient to demonstrate >=4+/5 strength in B LEs.    Time  6    Period  Weeks    Status  On-going      PT LONG TERM GOAL #3   Title  Patient to demonstrate R hip and ankle AROM symmetrical to L LE and without pain limiting.     Time  6    Period  Weeks    Status  On-going      PT LONG TERM GOAL #4   Title  Patient to report tolerance of 2 hours of walking without boot without pain in R foot or hip.    Time  6    Period  Weeks    Status  On-going      PT LONG TERM GOAL #5   Title  Patient to report tolerance of R sidelying for 4 hours without pain.    Time  6    Period  Weeks    Status  On-going            Plan - 09/01/17 1748    Clinical Impression Statement  Patient arrived to session with report of improvement in R hip and heel pain, noting benefit from STM and kinesiotaping. Good tolerance of STM along plantar fascia of R foot and R gastroc- increased tenderness along medial gastroc and TTP however tolerable. Worked on intrinsic muscle strengthening of R foot and ROM of great toe extension. Updated HEP to include these exercises and administered to patient who reported understanding. Good performance of hip strengthening activities today- intermittent cues to maintain upright stance during resisted hip abduction. Received kinesiotape to R foot at end of session. Reminded patient of precautions. Patient with no c/o pain at end of session.     PT Treatment/Interventions  ADLs/Self Care Home Management;Cryotherapy;Electrical Stimulation;Iontophoresis 4mg /ml Dexamethasone;Moist Heat;Ultrasound;Gait training;Stair training;Functional mobility training;Therapeutic activities;Therapeutic exercise;Manual techniques;Patient/family education;Orthotic Fit/Training;Neuromuscular re-education;Balance training;Passive range of motion;Dry needling;Energy conservation;Splinting;Taping;Vasopneumatic Device    Consulted and Agree with Plan of Care  Patient       Patient will benefit from  skilled  therapeutic intervention in order to improve the following deficits and impairments:  Decreased activity tolerance, Decreased strength, Pain, Difficulty walking, Decreased range of motion, Postural dysfunction, Impaired flexibility  Visit Diagnosis: Pain in right foot  Pain in right hip  Difficulty in walking, not elsewhere classified  Muscle weakness (generalized)  Other symptoms and signs involving the musculoskeletal system     Problem List Patient Active Problem List   Diagnosis Date Noted  . Dyspnea on exertion 11/19/2016  . Abnormal EKG 11/09/2016  . Family history of heart disease in female family member before age 61 11/09/2016  . Obesity (BMI 30-39.9) 11/05/2016  . Migraine headache   . Dyslipidemia 06/25/2016  . Epigastric pain 07/09/2015  . Heartburn 07/09/2015  . Nausea 07/09/2015  . Osteoporosis 10/25/2014  . Fatigue 08/10/2014  . Allergic rhinitis 11/08/2013  . Functional dyspepsia 11/08/2013  . Gastric irritation 11/08/2013  . Pain of right hip joint 11/08/2013  . Postmenopausal disorder 11/08/2013  . Rosacea 11/08/2013    Anette Guarneri, PT, DPT 09/01/17 5:53 PM   [facility] [address]  [address] [place], [place], [address] Phone: [phone]   Fax:  [phone]  Name: Kayla Lewis MRN: [id number] Date of Birth: 1960/03/07

## 2017-09-01 NOTE — Patient Instructions (Signed)
Great toe flexion + other toe extension 2x15 Seated great toe extension + heel raise 2x10 Towel curls 2x20

## 2017-09-06 ENCOUNTER — Ambulatory Visit: Payer: Managed Care, Other (non HMO)

## 2017-09-06 DIAGNOSIS — M79671 Pain in right foot: Secondary | ICD-10-CM | POA: Diagnosis not present

## 2017-09-06 DIAGNOSIS — M6281 Muscle weakness (generalized): Secondary | ICD-10-CM

## 2017-09-06 DIAGNOSIS — R29898 Other symptoms and signs involving the musculoskeletal system: Secondary | ICD-10-CM

## 2017-09-06 DIAGNOSIS — R262 Difficulty in walking, not elsewhere classified: Secondary | ICD-10-CM

## 2017-09-06 DIAGNOSIS — M25551 Pain in right hip: Secondary | ICD-10-CM

## 2017-09-06 NOTE — Therapy (Signed)
[facility] [address]  [address] [place], [place], [address] Phone: [phone]   Fax:  [phone]  Physical Therapy Treatment  Patient Details  Name: Kayla SinningLinda H Lewis MRN: [id number] Date of Birth: 1960-08-23 Referring Provider: Rodolph BongAdam Kendall, MD   Encounter Date: 09/06/2017  PT End of Session - 09/06/17 1630    Visit Number  7    Number of Visits  13    Date for PT Re-Evaluation  09/22/17    Authorization Type  [employer]ign[employer]    PT Start Time  1618    PT Stop Time  1705    PT Time Calculation (min)  47 min    Activity Tolerance  Patient tolerated treatment well    Behavior During Therapy  [facility] for tasks assessed/performed       Past Medical History:  Diagnosis Date  . Erosive gastritis   . Hiatal hernia   . History of abnormal Pap smear 1991  . Migraine age 57's   with aura  . Obesity (BMI 30-39.9) 11/05/2016  . Osteoporosis   . Shingles outbreak 06/2008   abdomen    Past Surgical History:  Procedure Laterality Date  . BREAST EXCISIONAL BIOPSY Left   . BREAST SURGERY Left 1997   lumpectomy benign  . CERVICAL POLYPECTOMY  1999  . CERVIX LESION DESTRUCTION    . COLONOSCOPY W/ BIOPSIES  09/22/10   1 polyp benign recheck in 10 years.  . TONSILLECTOMY  age 57    There were no vitals filed for this visit.  Subjective Assessment - 09/06/17 1625    Subjective  Pt. noting improvement in hip and heel pain over this past few days.      Pertinent History  shingles, osteoporosis, migraine, hiatal hernia, erosive gastritis, cervical polypectomy, benign breast lumpectomy    Diagnostic tests  07/30/17 R hip xray: negative; 07/30/17 R ankle xray: No acute bony abnormality.    Patient Stated Goals  dont want feet to hurt    Currently in Pain?  Yes    Pain Score  0-No pain   5/10 pain at worst with prolonged sitting    Pain Location  Hip    Pain Orientation  Right    Pain Descriptors / Indicators  Nagging    Pain Type  Chronic  pain    Multiple Pain Sites  Yes    Pain Score  1    Pain Location  Heel    Pain Orientation  Right    Pain Descriptors / Indicators  Aching    Pain Type  Chronic pain    Pain Onset  More than a month ago    Pain Frequency  Constant    Aggravating Factors   wt. bearing     Pain Relieving Factors  boot, foot soke                        [facility] Adult PT Treatment/Exercise - 09/06/17 1650      Knee/Hip Exercises: Stretches   Gastroc Stretch  Right;30 seconds;2 reps    Theme park managerGastroc Stretch Limitations  runner stretch into wall, calf + great toe extension stretch on wall     Other Knee/Hip Stretches  R gastroc prostretch 2 x 30 sec       Knee/Hip Exercises: Aerobic   Recumbent Bike  Lvl 2, 7 min       Knee/Hip Exercises: Standing   Heel Raises  20 reps;3 seconds;Both  Heel Raises Limitations  at chair     Hip Abduction  Right;Left;Knee straight;Stengthening   x 12 reps    Abduction Limitations  red looped TB at ankles     Hip Extension  Right;Left;Stengthening;Knee straight   x 12 reps    Extension Limitations  red looped TB at ankle     Functional Squat  15 reps;3 seconds    Functional Squat Limitations  TRX + heel raise at bottom of movement    SLS with Vectors  R SLS with cone opposite toe touch 2 x 20 sec     Other Standing Knee Exercises  Standing side stepping with green TB at forefoot 2 x 30 ft       Manual Therapy   Manual Therapy  Soft tissue mobilization;Passive ROM    Soft tissue mobilization  STM/strumming to R arch x 2 min with free-up to reduce friction; STM to R medial/distal calf in area of tenderness     Passive ROM  Passive PF stretch with therapist                PT Short Term Goals - 08/30/17 1800      PT SHORT TERM GOAL #1   Title  Patient to be independent with intial HEP.    Time  3    Period  Weeks    Status  Achieved        PT Long Term Goals - 08/16/17 1633      PT LONG TERM GOAL #1   Title  Patient to be independent  with advanced HEP.    Time  6    Period  Weeks    Status  On-going      PT LONG TERM GOAL #2   Title  Patient to demonstrate >=4+/5 strength in B LEs.    Time  6    Period  Weeks    Status  On-going      PT LONG TERM GOAL #3   Title  Patient to demonstrate R hip and ankle AROM symmetrical to L LE and without pain limiting.     Time  6    Period  Weeks    Status  On-going      PT LONG TERM GOAL #4   Title  Patient to report tolerance of 2 hours of walking without boot without pain in R foot or hip.    Time  6    Period  Weeks    Status  On-going      PT LONG TERM GOAL #5   Title  Patient to report tolerance of R sidelying for 4 hours without pain.    Time  6    Period  Weeks    Status  On-going            Plan - 09/06/17 1631    Clinical Impression Statement  [name] reporting improvement in R heel and hip pain over past few days.  Feels overall improvement in heel/arch pain ~ 35% and R hip pain ~ 75% since beginning therapy.  Notes hip strengthening HEP getting easier at this point thus issued green looped TB for increased resistance.  Session focusing on hip/ankle strengthening activities with addition of SLS vector to cones, which were well tolerated.  Pt. progressing well toward goals.      PT Treatment/Interventions  ADLs/Self Care Home Management;Cryotherapy;Electrical Stimulation;Iontophoresis 4mg /ml Dexamethasone;Moist Heat;Ultrasound;Gait training;Stair training;Functional mobility training;Therapeutic activities;Therapeutic exercise;Manual techniques;Patient/family education;Orthotic Fit/Training;Neuromuscular re-education;Balance training;Passive range of motion;Dry  needling;Energy conservation;Splinting;Taping;Vasopneumatic Device    Consulted and Agree with Plan of Care  Patient       Patient will benefit from skilled therapeutic intervention in order to improve the following deficits and impairments:  Decreased activity tolerance, Decreased strength, Pain,  Difficulty walking, Decreased range of motion, Postural dysfunction, Impaired flexibility  Visit Diagnosis: Pain in right foot  Pain in right hip  Difficulty in walking, not elsewhere classified  Muscle weakness (generalized)  Other symptoms and signs involving the musculoskeletal system     Problem List Patient Active Problem List   Diagnosis Date Noted  . Dyspnea on exertion 11/19/2016  . Abnormal EKG 11/09/2016  . Family history of heart disease in female family member before age 65 11/09/2016  . Obesity (BMI 30-39.9) 11/05/2016  . Migraine headache   . Dyslipidemia 06/25/2016  . Epigastric pain 07/09/2015  . Heartburn 07/09/2015  . Nausea 07/09/2015  . Osteoporosis 10/25/2014  . Fatigue 08/10/2014  . Allergic rhinitis 11/08/2013  . Functional dyspepsia 11/08/2013  . Gastric irritation 11/08/2013  . Pain of right hip joint 11/08/2013  . Postmenopausal disorder 11/08/2013  . Rosacea 11/08/2013    Kermit Balo, PTA 09/06/17 5:24 PM   [facility] [address]  [address] [place], [place], [address] Phone: [phone]   Fax:  [phone]  Name: JEANNY RYMER MRN: [id number] Date of Birth: 05/20/1960

## 2017-09-08 ENCOUNTER — Ambulatory Visit: Payer: Managed Care, Other (non HMO) | Admitting: Physical Therapy

## 2017-09-08 DIAGNOSIS — M79671 Pain in right foot: Secondary | ICD-10-CM

## 2017-09-08 DIAGNOSIS — M6281 Muscle weakness (generalized): Secondary | ICD-10-CM

## 2017-09-08 DIAGNOSIS — M25551 Pain in right hip: Secondary | ICD-10-CM

## 2017-09-08 DIAGNOSIS — R262 Difficulty in walking, not elsewhere classified: Secondary | ICD-10-CM

## 2017-09-08 DIAGNOSIS — R29898 Other symptoms and signs involving the musculoskeletal system: Secondary | ICD-10-CM

## 2017-09-08 NOTE — Patient Instructions (Addendum)

## 2017-09-08 NOTE — Therapy (Signed)
[facility] [place] [address]  [address] [place], [place], [address] Phone: [phone]   Fax:  [phone]  Physical Therapy Treatment  Patient Details  Name: Kayla SinningLinda H Lewis MRN: [id number] Date of Birth: May 21, 1960 Referring Provider: Rodolph BongAdam Kendall, MD   Encounter Date: 09/08/2017  PT End of Session - 09/08/17 1750    Visit Number  8    Number of Visits  13    Date for PT Re-Evaluation  09/22/17    Authorization Type  [employer]    PT Start Time  1613    PT Stop Time  1658    PT Time Calculation (min)  45 min    Activity Tolerance  Patient tolerated treatment well    Behavior During Therapy  [facility] for tasks assessed/performed       Past Medical History:  Diagnosis Date  . Erosive gastritis   . Hiatal hernia   . History of abnormal Pap smear 1991  . Migraine age 57's   with aura  . Obesity (BMI 30-39.9) 11/05/2016  . Osteoporosis   . Shingles outbreak 06/2008   abdomen    Past Surgical History:  Procedure Laterality Date  . BREAST EXCISIONAL BIOPSY Left   . BREAST SURGERY Left 1997   lumpectomy benign  . CERVICAL POLYPECTOMY  1999  . CERVIX LESION DESTRUCTION    . COLONOSCOPY W/ BIOPSIES  09/22/10   1 polyp benign recheck in 10 years.  . TONSILLECTOMY  age 57    There were no vitals filed for this visit.  Subjective Assessment - 09/08/17 1614    Subjective  Reports R hip pain has been good lately- had some pain down the lateral thigh last night and contributes it to the workout. Says R foot pain has been off the charts today. Has worn boot today and worked with Systems analystpersonal trainer yesterday.     Pertinent History  shingles, osteoporosis, migraine, hiatal hernia, erosive gastritis, cervical polypectomy, benign breast lumpectomy    Diagnostic tests  07/30/17 R hip xray: negative; 07/30/17 R ankle xray: No acute bony abnormality.    Patient Stated Goals  dont want feet to hurt    Currently in Pain?  No/denies    Pain Score   5    Pain Location  Heel    Pain Descriptors / Indicators  Aching    Pain Type  Chronic pain                       [facility] Adult PT Treatment/Exercise - 09/08/17 0001      Exercises   Exercises  Ankle      Knee/Hip Exercises: Aerobic   Nustep  L3 x 6min      Manual Therapy   Manual Therapy  Soft tissue mobilization;Passive ROM    Soft tissue mobilization  R plantar fascia and gastroc- signfificant tenderness and soft tissue restriction in medial gastroc and central plantar surface    Passive ROM  passive DF + great toe stretch on R 3x40 sec      Ankle Exercises: Stretches   Soleus Stretch  2 reps;30 seconds;Limitations    Soleus Stretch Limitations  R on prostretch at Teacher, early years/precounter top    Gastroc Stretch  2 reps;30 seconds;Limitations    Gastroc Stretch Limitations  R on prostretch at counter top      Ankle Exercises: Standing   SLS  B SLS LE on foam with perturbations x4 min  Heel Raises  Both;10 reps;Limitations   cues for slow eccentric lower   Heel Raises Limitations  2x10 eccentric heel raises at UBE    Other Standing Ankle Exercises  R great toe extension self-mobs against wall 10x5"      Ankle Exercises: Seated   Other Seated Ankle Exercises  B ankle inversion with green band x 15 each LE             PT Education - 09/08/17 1750    Education Details  update to HEP    Person(s) Educated  Patient    Methods  Explanation;Demonstration;Tactile cues;Verbal cues;Handout    Comprehension  Returned demonstration;Verbalized understanding       PT Short Term Goals - 08/30/17 1800      PT SHORT TERM GOAL #1   Title  Patient to be independent with intial HEP.    Time  3    Period  Weeks    Status  Achieved        PT Long Term Goals - 08/16/17 1633      PT LONG TERM GOAL #1   Title  Patient to be independent with advanced HEP.    Time  6    Period  Weeks    Status  On-going      PT LONG TERM GOAL #2   Title  Patient to demonstrate >=4+/5  strength in B LEs.    Time  6    Period  Weeks    Status  On-going      PT LONG TERM GOAL #3   Title  Patient to demonstrate R hip and ankle AROM symmetrical to L LE and without pain limiting.     Time  6    Period  Weeks    Status  On-going      PT LONG TERM GOAL #4   Title  Patient to report tolerance of 2 hours of walking without boot without pain in R foot or hip.    Time  6    Period  Weeks    Status  On-going      PT LONG TERM GOAL #5   Title  Patient to report tolerance of R sidelying for 4 hours without pain.    Time  6    Period  Weeks    Status  On-going            Plan - 09/08/17 1751    Clinical Impression Statement  Patient arrived to session with report of flare up of R foot pain, starting last night. Unsure of cause but mentions exercising with personal trainer yesterday. Focused session on R foot rather than hip today. Patient tolerated gentle STM along R plantar fascia and gastroc- significant tenderness and soft tissue restriction in medial gastroc and central plantar surface. Patient reporting tolerance of all manual therapy performed this session. Patient with significant instability with SLS on foam on B LEs; tendency for inversion suggesting weakness in peroneals. Worked on resisted ankle ROM at end of session and added these exercises to HEP. Patient received handout and reported understanding. Also provided edu on DN as patient may receive beenfit. Patient in agreement. Patient with question about using already-owned night splints- advised patient to try these and monitor symptoms.     PT Treatment/Interventions  ADLs/Self Care Home Management;Cryotherapy;Electrical Stimulation;Iontophoresis 4mg /ml Dexamethasone;Moist Heat;Ultrasound;Gait training;Stair training;Functional mobility training;Therapeutic activities;Therapeutic exercise;Manual techniques;Patient/family education;Orthotic Fit/Training;Neuromuscular re-education;Balance training;Passive range of  motion;Dry needling;Energy conservation;Splinting;Taping;Vasopneumatic Device    Consulted and Agree  with Plan of Care  Patient       Patient will benefit from skilled therapeutic intervention in order to improve the following deficits and impairments:  Decreased activity tolerance, Decreased strength, Pain, Difficulty walking, Decreased range of motion, Postural dysfunction, Impaired flexibility  Visit Diagnosis: Pain in right foot  Pain in right hip  Difficulty in walking, not elsewhere classified  Muscle weakness (generalized)  Other symptoms and signs involving the musculoskeletal system     Problem List Patient Active Problem List   Diagnosis Date Noted  . Dyspnea on exertion 11/19/2016  . Abnormal EKG 11/09/2016  . Family history of heart disease in female family member before age 58 11/09/2016  . Obesity (BMI 30-39.9) 11/05/2016  . Migraine headache   . Dyslipidemia 06/25/2016  . Epigastric pain 07/09/2015  . Heartburn 07/09/2015  . Nausea 07/09/2015  . Osteoporosis 10/25/2014  . Fatigue 08/10/2014  . Allergic rhinitis 11/08/2013  . Functional dyspepsia 11/08/2013  . Gastric irritation 11/08/2013  . Pain of right hip joint 11/08/2013  . Postmenopausal disorder 11/08/2013  . Rosacea 11/08/2013    Anette GuarneriYevgeniya Kenneith Stief, PT, DPT 09/08/17 5:57 PM   [facility] [place] [address]  [address] [place], [place], [address] Phone: [phone]   Fax:  [phone]  Name: Kayla SinningLinda H Lewis MRN: [id number] Date of Birth: [birth year]

## 2017-09-15 ENCOUNTER — Encounter: Payer: Self-pay | Admitting: Physical Therapy

## 2017-09-15 ENCOUNTER — Ambulatory Visit: Payer: Managed Care, Other (non HMO) | Admitting: Physical Therapy

## 2017-09-15 DIAGNOSIS — M25551 Pain in right hip: Secondary | ICD-10-CM

## 2017-09-15 DIAGNOSIS — M6281 Muscle weakness (generalized): Secondary | ICD-10-CM

## 2017-09-15 DIAGNOSIS — M79671 Pain in right foot: Secondary | ICD-10-CM

## 2017-09-15 DIAGNOSIS — R262 Difficulty in walking, not elsewhere classified: Secondary | ICD-10-CM

## 2017-09-15 DIAGNOSIS — R29898 Other symptoms and signs involving the musculoskeletal system: Secondary | ICD-10-CM

## 2017-09-15 NOTE — Therapy (Signed)
[facility] [address]  [address] [place], [place], [address] Phone: [phone]   Fax:  [phone]  Physical Therapy Treatment  Patient Details  Name: Kayla Lewis MRN: [id number] Date of Birth: 31-May-1960 Referring Provider: Rodolph BongAdam Kendall, MD   Encounter Date: 09/15/2017  PT End of Session - 09/15/17 1700    Visit Number  9    Number of Visits  13    Date for PT Re-Evaluation  09/22/17    Authorization Type  [employer]    PT Start Time  1616    PT Stop Time  1657    PT Time Calculation (min)  41 min    Activity Tolerance  Patient tolerated treatment well    Behavior During Therapy  [facility] for tasks assessed/performed       Past Medical History:  Diagnosis Date  . Erosive gastritis   . Hiatal hernia   . History of abnormal Pap smear 1991  . Migraine age 57's   with aura  . Obesity (BMI 30-39.9) 11/05/2016  . Osteoporosis   . Shingles outbreak 06/2008   abdomen    Past Surgical History:  Procedure Laterality Date  . BREAST EXCISIONAL BIOPSY Left   . BREAST SURGERY Left 1997   lumpectomy benign  . CERVICAL POLYPECTOMY  1999  . CERVIX LESION DESTRUCTION    . COLONOSCOPY W/ BIOPSIES  09/22/10   1 polyp benign recheck in 10 years.  . TONSILLECTOMY  age 57    There were no vitals filed for this visit.  Subjective Assessment - 09/15/17 1618    Subjective  Patient report she has been good since last session. Still has pain in her heel and knows that is not going away. Is trying to avoid heel strike when she walks. Thinks the squat + heel raise may have flared her up on TRX. Reports 90% improvement in hip. However also notes 60% improvement in R foot., excluding her heel.    Pertinent History  shingles, osteoporosis, migraine, hiatal hernia, erosive gastritis, cervical polypectomy, benign breast lumpectomy    Diagnostic tests  07/30/17 R hip xray: negative; 07/30/17 R ankle xray: No acute bony abnormality.    Patient  Stated Goals  dont want feet to hurt                       [facility]/Exercise - 09/15/17 0001      Exercises   Exercises  Knee/Hip      Knee/Hip Exercises: Stretches   Hip Flexor Stretch  Right;Left;30 seconds;Limitations;1 rep    Hip Flexor Stretch Limitations  LE on bolster with strap    ITB Stretch  Right;2 reps;30 seconds   cues to avoid trunk rotation   ITB Stretch Limitations  supine strap    Piriformis Stretch  Right;2 reps;30 seconds    Piriformis Stretch Limitations  KTOS      Knee/Hip Exercises: Plyometrics   Other Plyometric Exercises  prone plank elbows and toes 2x30"   cues maintain straight spine     Knee/Hip Exercises: Standing   Functional Squat  1 set;15 reps;Limitations   cues to decrease speed and upright turnk   Functional Squat Limitations  orange med ball at chest      Knee/Hip Exercises: Prone   Other Prone Exercises  firehydrant x15 each LE   manual cues to avoid trunk rotation   Other Prone Exercises  Alternating "Bird Dog" x 10 reps  each way       Ankle Exercises: Aerobic   Nustep  L4x6 min      Ankle Exercises: Stretches   Soleus Stretch  1 rep;30 seconds;Limitations    Soleus Stretch Limitations  toes up on wall    Gastroc Stretch  1 rep;30 seconds;Limitations    Gastroc Stretch Limitations  toes up on wall      Ankle Exercises: Standing   Heel Raises  Both;15 reps;Limitations    Heel Raises Limitations  2x15 with ball bw heels at counter top    Other Standing Ankle Exercises  Resisted hip ABD & ADD with red TB around toes x10 each direction with chair support   cues to decrease speed   Other Standing Ankle Exercises  SLS + resisted trunk rotation with green TB x 10 each LE   cues for control              PT Short Term Goals - 08/30/17 1800      PT SHORT TERM GOAL #1   Title  Patient to be independent with intial HEP.    Time  3    Period  Weeks    Status  Achieved        PT Long Term Goals  - 08/16/17 1633      PT LONG TERM GOAL #1   Title  Patient to be independent with advanced HEP.    Time  6    Period  Weeks    Status  On-going      PT LONG TERM GOAL #2   Title  Patient to demonstrate >=4+/5 strength in B LEs.    Time  6    Period  Weeks    Status  On-going      PT LONG TERM GOAL #3   Title  Patient to demonstrate R hip and ankle AROM symmetrical to L LE and without pain limiting.     Time  6    Period  Weeks    Status  On-going      PT LONG TERM GOAL #4   Title  Patient to report tolerance of 2 hours of walking without boot without pain in R foot or hip.    Time  6    Period  Weeks    Status  On-going      PT LONG TERM GOAL #5   Title  Patient to report tolerance of R sidelying for 4 hours without pain.    Time  6    Period  Weeks    Status  On-going            Plan - 09/15/17 1701    Clinical Impression Statement  Patient arrived to session with no new complaints. Reports 90% improvement in R hip, 60% improvement in R foot, however still having heel pain. Patient tolerated progressive hip strengthening this session with no c/o hip pain. Patient reporting mild burning of L foot with SLS activities, improved after gastroc and soleus stretching. Good tolerance of heel raises this session, however reports she believes she was flared up by squat with heel raise exercises a couple sessions ago. Patient denied pain at end of session. Patient with question about exercises to avoid while working with personal trainer- advised patient to try bike or rowing for cardio as patient mentioned she is guarding while walking and avoiding heel strike. Patient reported understanding.     PT Treatment/Interventions  ADLs/Self Care Home Management;Cryotherapy;Electrical Stimulation;Iontophoresis 4mg /ml Dexamethasone;Moist  Heat;Ultrasound;Gait training;Stair training;Functional mobility training;Therapeutic activities;Therapeutic exercise;Manual techniques;Patient/family  education;Orthotic Fit/Training;Neuromuscular re-education;Balance training;Passive range of motion;Dry needling;Energy conservation;Splinting;Taping;Vasopneumatic Device    PT Next Visit Plan  progress note next session    Consulted and Agree with Plan of Care  Patient       Patient will benefit from skilled therapeutic intervention in order to improve the following deficits and impairments:  Decreased activity tolerance, Decreased strength, Pain, Difficulty walking, Decreased range of motion, Postural dysfunction, Impaired flexibility  Visit Diagnosis: Pain in right foot  Pain in right hip  Difficulty in walking, not elsewhere classified  Other symptoms and signs involving the musculoskeletal system  Muscle weakness (generalized)     Problem List Patient Active Problem List   Diagnosis Date Noted  . Dyspnea on exertion 11/19/2016  . Abnormal EKG 11/09/2016  . Family history of heart disease in female family member before age 58 11/09/2016  . Obesity (BMI 30-39.9) 11/05/2016  . Migraine headache   . Dyslipidemia 06/25/2016  . Epigastric pain 07/09/2015  . Heartburn 07/09/2015  . Nausea 07/09/2015  . Osteoporosis 10/25/2014  . Fatigue 08/10/2014  . Allergic rhinitis 11/08/2013  . Functional dyspepsia 11/08/2013  . Gastric irritation 11/08/2013  . Pain of right hip joint 11/08/2013  . Postmenopausal disorder 11/08/2013  . Rosacea 11/08/2013    Anette GuarneriYevgeniya Jadamarie Butson, PT, DPT 09/15/17 5:05 PM   [facility] [place] [address]  [address] [place], [place], [address] Phone: [phone]   Fax:  [phone]  Name: Kayla Lewis MRN: [id number] Date of Birth: 02-08-1960

## 2017-09-20 ENCOUNTER — Ambulatory Visit: Payer: Managed Care, Other (non HMO) | Admitting: Physical Therapy

## 2017-09-20 DIAGNOSIS — M79671 Pain in right foot: Secondary | ICD-10-CM | POA: Diagnosis not present

## 2017-09-20 DIAGNOSIS — R262 Difficulty in walking, not elsewhere classified: Secondary | ICD-10-CM

## 2017-09-20 DIAGNOSIS — M6281 Muscle weakness (generalized): Secondary | ICD-10-CM

## 2017-09-20 DIAGNOSIS — R29898 Other symptoms and signs involving the musculoskeletal system: Secondary | ICD-10-CM

## 2017-09-20 DIAGNOSIS — M25551 Pain in right hip: Secondary | ICD-10-CM

## 2017-09-20 NOTE — Therapy (Addendum)
[facility] [address]  [place], [place], [address] Phone: [phone]   Fax:  [phone]  Physical Therapy Treatment  Patient Details  Name: Kayla Lewis MRN: [id number] Date of Birth: [birth year] Referring Provider: Vickki Hearing, MD   Encounter Date: 09/20/2017  PT End of Session - 09/20/17 1800    Visit Number  10    Number of Visits  13    Date for PT Re-Evaluation  09/22/17    Authorization Type  [employer]    PT Start Time  1615    PT Stop Time  1703    PT Time Calculation (min)  48 min    Activity Tolerance  Patient tolerated treatment well    Behavior During Therapy  [facility] for tasks assessed/performed       Past Medical History:  Diagnosis Date  . Erosive gastritis   . Hiatal hernia   . History of abnormal Pap smear 1991  . Migraine age 23's   with aura  . Obesity (BMI 30-39.9) 11/05/2016  . Osteoporosis   . Shingles outbreak 06/2008   abdomen    Past Surgical History:  Procedure Laterality Date  . BREAST EXCISIONAL BIOPSY Left   . BREAST SURGERY Left 1997   lumpectomy benign  . CERVICAL POLYPECTOMY  1999  . CERVIX LESION DESTRUCTION    . COLONOSCOPY W/ BIOPSIES  09/22/10   1 polyp benign recheck in 10 years.  . TONSILLECTOMY  age 13    There were no vitals filed for this visit.  Subjective Assessment - 09/20/17 1616    Subjective  Patient reports that she has been feeling pretty good. Had been on her feet a lot yesterday and attributes some heel tenderness to that. Reports 90% improvement in R hip, 50% improvement in R foot.     Pertinent History  shingles, osteoporosis, migraine, hiatal hernia, erosive gastritis, cervical polypectomy, benign breast lumpectomy    Diagnostic tests  07/30/17 R hip xray: negative; 07/30/17 R ankle xray: No acute bony abnormality.    Patient Stated Goals  dont want feet to hurt    Pain Score  2    Pain Location  Heel    Pain Orientation  Right    Pain  Descriptors / Indicators  Aching    Pain Type  Chronic pain         [facility] PT Assessment - 09/20/17 0001      AROM   Right/Left Hip  Right    Right Hip Flexion  112    Right Hip External Rotation   37    Right Hip Internal Rotation   37    Right Hip ABduction  45   tightness   Right/Left Ankle  Right    Right Ankle Dorsiflexion  18    Right Ankle Plantar Flexion  48    Right Ankle Inversion  40    Right Ankle Eversion  16      Strength   Right/Left Hip  Right;Left    Right Hip Flexion  4/5    Right Hip ABduction  4/5    Right Hip ADduction  4/5    Left Hip Flexion  4+/5    Left Hip ABduction  4/5    Left Hip ADduction  4/5    Right/Left Knee  Right;Left    Right Knee Flexion  4+/5    Right Knee Extension  5/5    Left Knee Flexion  4+/5  Left Knee Extension  5/5    Right/Left Ankle  Right;Left    Right Ankle Dorsiflexion  4+/5    Right Ankle Plantar Flexion  4+/5    Right Ankle Inversion  4/5    Right Ankle Eversion  4+/5    Left Ankle Dorsiflexion  4+/5    Left Ankle Plantar Flexion  4+/5    Left Ankle Inversion  4+/5    Left Ankle Eversion  4+/5                   [facility] Adult PT Treatment/Exercise - 09/20/17 0001      Exercises   Exercises  Knee/Hip      Knee/Hip Exercises: Stretches   ITB Stretch  Right;1 rep;30 seconds    ITB Stretch Limitations  supine strap    Piriformis Stretch  Right;1 rep;20 seconds;Limitations    Piriformis Stretch Limitations  KTOS      Knee/Hip Exercises: Aerobic   Nustep  L4 x 7 min      Knee/Hip Exercises: Standing   Other Standing Knee Exercises  wall bal clams with red TB 10x each with chair support      Knee/Hip Exercises: Supine   Bridges Limitations  10x; 10x with red TB around knees    Bridges with Clamshell  Both;Strengthening;10 reps;Limitations   cues for form     Knee/Hip Exercises: Prone   Other Prone Exercises  firehydrant 5x each side with yellow TB around knees    Other Prone Exercises   Alternating "Bird Dog" x 10 reps each way       Ankle Exercises: Stretches   Plantar Fascia Stretch  1 rep;30 seconds;Limitations   DF + toe extension against wall     Ankle Exercises: Standing   Other Standing Ankle Exercises  SLS on foam + med ball toss 2x30" each LE             PT Education - 09/20/17 1800    Education Details  consolidated HEP program to perform with personal trainer    Person(s) Educated  Patient    Methods  Explanation;Demonstration;Tactile cues;Verbal cues;Handout    Comprehension  Returned demonstration;Verbalized understanding       PT Short Term Goals - 09/20/17 1619      PT SHORT TERM GOAL #1   Title  Patient to be independent with intial HEP.    Time  3    Period  Weeks    Status  Achieved        PT Long Term Goals - 09/20/17 1619      PT LONG TERM GOAL #1   Title  Patient to be independent with advanced HEP.    Time  6    Period  Weeks    Status  Achieved      PT LONG TERM GOAL #2   Title  Patient to demonstrate >=4+/5 strength in B LEs.    Time  6    Period  Weeks    Status  Partially Met   improvements noted with B knee flexion and extension, and B ankle strength in all planes; hip strength still 4/5     PT LONG TERM GOAL #3   Title  Patient to demonstrate R hip and ankle AROM symmetrical to L LE and without pain limiting.     Time  6    Period  Weeks    Status  Partially Met   improvements noted in R hip flexion and R  ankle DF, PF, eversion     PT LONG TERM GOAL #4   Title  Patient to report tolerance of 2 hours of walking without boot without pain in R foot or hip.    Time  6    Period  Weeks    Status  Partially Met   reports 47mn-1 hour of walking before onset of R heel pain     PT LONG TERM GOAL #5   Title  Patient to report tolerance of R sidelying for 4 hours without pain.    Time  6    Period  Weeks    Status  Achieved   able to sleep all night on R side           Plan - 09/20/17 1801    Clinical  Impression Statement  Patient arrived to session with report of 90% improvement in R hip and 50% improvement in R foot since initial eval. Reports still some R heel pain with prolonged walking. Patient reporting that she is comfortable being placed on 30 day hold at this time. Updated goals this session- improvements noted with B knee flexion and extension, and B ankle strength in all planes. Improvements also noted in R hip flexion and R ankle DF, PF, eversion. Able to walk 30 min -1 hour before onset of heel pain thus far. Patient met R sidelying goal at this time- able to sleep throughout the night without hip pain. Worked on LE strengthening and review of consolidated HEP handout to be performed with pPhysiological scientist All questions answered. Patient without c/o pain at end of session. To be placed on 30 day hold at this time per patient's request.     Clinical Impairments Affecting Rehab Potential  osteoporosis     PT Treatment/Interventions  ADLs/Self Care Home Management;Cryotherapy;Electrical Stimulation;Iontophoresis '4mg'$ /ml Dexamethasone;Moist Heat;Ultrasound;Gait training;Stair training;Functional mobility training;Therapeutic activities;Therapeutic exercise;Manual techniques;Patient/family education;Orthotic Fit/Training;Neuromuscular re-education;Balance training;Passive range of motion;Dry needling;Energy conservation;Splinting;Taping;Vasopneumatic Device    PT Next Visit Plan  30 day hold at this time    Consulted and Agree with Plan of Care  Patient       Patient will benefit from skilled therapeutic intervention in order to improve the following deficits and impairments:  Decreased activity tolerance, Decreased strength, Pain, Difficulty walking, Decreased range of motion, Postural dysfunction, Impaired flexibility  Visit Diagnosis: Pain in right foot  Pain in right hip  Difficulty in walking, not elsewhere classified  Other symptoms and signs involving the musculoskeletal  system  Muscle weakness (generalized)     Problem List Patient Active Problem List   Diagnosis Date Noted  . Dyspnea on exertion 11/19/2016  . Abnormal EKG 11/09/2016  . Family history of heart disease in female family member before age 281510/15/2018  . Obesity (BMI 30-39.9) 11/05/2016  . Migraine headache   . Dyslipidemia 06/25/2016  . Epigastric pain 07/09/2015  . Heartburn 07/09/2015  . Nausea 07/09/2015  . Osteoporosis 10/25/2014  . Fatigue 08/10/2014  . Allergic rhinitis 11/08/2013  . Functional dyspepsia 11/08/2013  . Gastric irritation 11/08/2013  . Pain of right hip joint 11/08/2013  . Postmenopausal disorder 11/08/2013  . Rosacea 11/08/2013    YJanene Harvey PT, DPT 09/20/17 6:09 PM   [facility] [address] [place] [address]Phone: [phone]  Fax:  [phone] Name: LARRIYANA RODELLMRN: [id number] of Birth: 107-03-1960 PHYSICAL THERAPY DISCHARGE SUMMARY  Visits from Start of Care: 10  Current functional level related to goals / functional outcomes: See above clinical impression   Remaining deficits: Decreased strength, decreased ROM, decreased walking tolerance   Education / Equipment: HEP  Plan: Patient agrees to discharge.  Patient goals were partially met. Patient is being discharged due to being pleased with the current functional level.  ?????     Janene Harvey, PT, DPT 10/25/17 2:13 PM

## 2017-09-22 ENCOUNTER — Ambulatory Visit: Payer: Managed Care, Other (non HMO) | Admitting: Physical Therapy

## 2017-10-29 ENCOUNTER — Encounter: Payer: Self-pay | Admitting: Family Medicine

## 2017-11-05 ENCOUNTER — Ambulatory Visit (INDEPENDENT_AMBULATORY_CARE_PROVIDER_SITE_OTHER): Payer: Managed Care, Other (non HMO) | Admitting: Certified Nurse Midwife

## 2017-11-05 ENCOUNTER — Other Ambulatory Visit: Payer: Self-pay

## 2017-11-05 ENCOUNTER — Encounter: Payer: Self-pay | Admitting: Certified Nurse Midwife

## 2017-11-05 ENCOUNTER — Other Ambulatory Visit (HOSPITAL_COMMUNITY)
Admission: RE | Admit: 2017-11-05 | Discharge: 2017-11-05 | Disposition: A | Discharge status: Home / Self Care | Payer: Managed Care, Other (non HMO) | Source: Ambulatory Visit | Attending: Obstetrics & Gynecology | Admitting: Obstetrics & Gynecology

## 2017-11-05 VITALS — BP 112/80 | HR 70 | Resp 16 | Ht 64.0 in | Wt 204.0 lb

## 2017-11-05 DIAGNOSIS — Z01419 Encounter for gynecological examination (general) (routine) without abnormal findings: Secondary | ICD-10-CM

## 2017-11-05 DIAGNOSIS — N952 Postmenopausal atrophic vaginitis: Secondary | ICD-10-CM | POA: Diagnosis not present

## 2017-11-05 DIAGNOSIS — Z124 Encounter for screening for malignant neoplasm of cervix: Secondary | ICD-10-CM

## 2017-11-05 DIAGNOSIS — N951 Menopausal and female climacteric states: Secondary | ICD-10-CM | POA: Diagnosis not present

## 2017-11-05 NOTE — Progress Notes (Signed)
57 y.o. G47P2002 Divorced  Caucasian Fe here for annual exam. Menopausal no HRT. Denies vaginal bleeding . Some vaginal dryness and urinary "feel like I am not emptying" but changes position and no further urine present. Working on weight loss with trainer and exercise. Has gluten free diet with more plant based diet, with fruit and vegetables. Taking Prolia due to Osteoporosis, tolerating well. Working on diet for calcium and Vitamin D.Sees PCP for labs and aex. No other health issues today. Took vacation to beach with family.  Patient's last menstrual period was 05/27/2007.          Sexually active: No.  The current method of family planning is post menopausal status.    Exercising: Yes.    weight & cardio Smoker:  no  Review of Systems  Constitutional:       Weight gain, headache, sinusitis  HENT: Negative.   Eyes: Negative.   Respiratory: Negative.   Cardiovascular: Negative.   Gastrointestinal: Negative.   Genitourinary:       Night urination  Musculoskeletal: Negative.   Skin: Negative.   Neurological: Negative.   Endo/Heme/Allergies: Negative.        Craving sweets  Psychiatric/Behavioral: Negative.     Health Maintenance: Pap:  10-17-14 neg HPV HR neg History of Abnormal Pap: yes MMG:  3/19 bilateral & left breast u/s 4/19 category c  Density birads 1:neg Self Breast exams: yes Colonoscopy:  2012 polyp f/u 51yrs BMD:   2018 osteoporosis TDaP:  2017 Shingles: 2012 Pneumonia: not done Hep C and HIV: Hep c done 2017, HIV neg 2017 Labs: PCP   reports that she quit smoking about 19 years ago. Her smoking use included cigarettes. She has a 1.25 pack-year smoking history. She has never used smokeless tobacco. She reports that she does not drink alcohol or use drugs.  Past Medical History:  Diagnosis Date  . Erosive gastritis   . Hiatal hernia   . History of abnormal Pap smear 1991  . 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 Obesity (BMI 30-39.9) 11/05/2016  . Osteoporosis   .  Shingles outbreak 06/2008   abdomen    Past Surgical History:  Procedure Laterality Date  . BREAST EXCISIONAL BIOPSY Left   . BREAST SURGERY Left 1997   lumpectomy benign  . CERVICAL POLYPECTOMY  1999  . CERVIX LESION DESTRUCTION    . COLONOSCOPY W/ BIOPSIES  09/22/10   1 polyp benign recheck in 10 years.  . TONSILLECTOMY  age 72    Current Outpatient Medications  Medication Sig Dispense Refill  . cetirizine (ZYRTEC) 10 MG tablet Take 10 mg by mouth 2 (two) times daily.     . Cholecalciferol (VITAMIN D3) 1000 units CAPS Take by mouth.     . denosumab (PROLIA) 60 MG/ML SOSY injection     . Glucosamine-Chondroitin (OSTEO BI-FLEX REGULAR STRENGTH PO) Take by mouth 2 (two) times daily.    . Multiple Vitamins-Minerals (WOMENS ONE DAILY PO) Take by mouth.    . QUDEXY XR 200 MG CS24 sprinkle capsule Take by mouth daily.  5  . ranitidine (ZANTAC) 150 MG tablet Take 1 tablet (150 mg total) by mouth daily. 30 tablet 5  . SUMAtriptan (IMITREX) 100 MG tablet     . albuterol (PROVENTIL HFA;VENTOLIN HFA) 108 (90 Base) MCG/ACT inhaler Inhale 2 puffs into the lungs every 6 (six) hours as needed for wheezing or shortness of breath. (Patient not taking: Reported on 11/05/2017) 18 g 0   No  current facility-administered medications for this visit.     Family History  Problem Relation Age of Onset  . AAA (abdominal aortic aneurysm) Mother 58       heavy smoker   . Deep vein thrombosis Mother   . Diabetes Mother   . Heart disease Mother   . COPD Father   . Bladder Cancer Father   . Depression Sister   . Hypertension Sister   . Obesity Sister   . Hyperlipidemia Sister   . Heart disease Sister 20       MI  . Asthma Sister   . Obesity Sister   . Breast cancer Maternal Grandmother     ROS:  Pertinent items are noted in HPI.  Otherwise, a comprehensive ROS was negative.  Exam:   BP 112/80   Pulse 70   Resp 16   Ht 5\' 4"  (1.626 m)   Wt 204 lb (92.5 kg)   LMP 05/27/2007   BMI 35.02 kg/m   Height: 5\' 4"  (162.6 cm) Ht Readings from Last 3 Encounters:  11/05/17 5\' 4"  (1.626 m)  07/28/17 5\' 4"  (1.626 m)  02/16/17 5\' 4"  (1.626 m)    General appearance: alert, cooperative and appears stated age Head: Normocephalic, without obvious abnormality, atraumatic Neck: no adenopathy, supple, symmetrical, trachea midline and thyroid normal to inspection and palpation Lungs: clear to auscultation bilaterally Breasts: normal appearance, no masses or tenderness, No nipple retraction or dimpling, No nipple discharge or bleeding, No axillary or supraclavicular adenopathy Heart: regular rate and rhythm Abdomen: soft, non-tender; no masses,  no organomegaly Extremities: extremities normal, atraumatic, no cyanosis or edema Skin: Skin color, texture, turgor normal. No rashes or lesions Lymph nodes: Cervical, supraclavicular, and axillary nodes normal. No abnormal inguinal nodes palpated Neurologic: Grossly normal   Pelvic: External genitalia:  no lesions, atrophic appearance              Urethra:  normal appearing urethra with no masses, tenderness or lesions  Urethral meatus prominent and dryness noted around meatus              Bartholin's and Skene's: normal                 Vagina: atrophic appearing vagina with pale color and scant discharge, no lesions, non tender              Cervix: no cervical motion tenderness, no lesions and normal appearance              Pap taken: Yes.   Bimanual Exam:  Uterus:  normal size, contour, position, consistency, mobility, non-tender and anteverted              Adnexa: normal adnexa and no mass, fullness, tenderness               Rectovaginal: Confirms               Anus:  normal sphincter tone, no lesions  Chaperone present: yes  A:  Well Woman with normal exam  Menopausal no HRT.  Atrophic vaginitis  History of Osteoporosis with PCP management on Prolia   P:   Reviewed health and wellness pertinent to exam  Discussed need to advise if vaginal  bleeding  Discussed atrophic vaginitis and etiology. Discussed OTC product use, Replens, Coconut or Olive oil use for treatment of dryness with instructions. Also utilize around urinary meatus which should help with dryness changes. Questions addressed.  Continue follow up with PCP as indicated  with Osteoporosis  Pap smear: yes   counseled on breast self exam, mammography screening, adequate intake of calcium and vitamin D, diet and exercise, Kegel's exercises  return annually or prn  An After Visit Summary was printed and given to the patient.

## 2017-11-05 NOTE — Patient Instructions (Addendum)
Vitamin D 1000 IU daily Coconut or Olive oil for dryness daily Atrophic Vaginitis Atrophic vaginitis is when the tissues that line the vagina become dry and thin. This is caused by a drop in estrogen. Estrogen helps:  To keep the vagina moist.  To make a clear fluid that helps: ? To lubricate the vagina for sex. ? To protect the vagina from infection.  If the lining of the vagina is dry and thin, it may:  Make sex painful. It may also cause bleeding.  Cause a feeling of: ? Burning. ? Irritation. ? Itchiness.  Make an exam of your vagina painful. It may also cause bleeding.  Make you lose interest in sex.  Cause a burning feeling when you pee.  Make your vaginal fluid (discharge) brown or yellow.  For some women, there are no symptoms. This condition is most common in women who do not get their regular menstrual periods anymore (menopause). This often starts when a woman is 58-48 years old. Follow these instructions at home:  Take medicines only as told by your doctor. Do not use any herbal or alternative medicines unless your doctor says it is okay.  Use over-the-counter products for dryness only as told by your doctor. These include: ? Creams. ? Lubricants. ? Moisturizers.  Do not douche.  Do not use products that can make your vagina dry. These include: ? Scented feminine sprays. ? Scented tampons. ? Scented soaps.  If it hurts to have sex, tell your sexual partner. Contact a doctor if:  Your discharge looks different than normal.  Your vagina has an unusual smell.  You have new symptoms.  Your symptoms do not get better with treatment.  Your symptoms get worse. This information is not intended to replace advice given to you by your health care provider. Make sure you discuss any questions you have with your health care provider. Document Released: 07/01/2007 Document Revised: 06/20/2015 Document Reviewed: 01/03/2014 Elsevier Interactive Patient Education   2018 [employer].    EXERCISE AND DIET:  We recommended that you start or continue a regular exercise program for good health. Regular exercise means any activity that makes your heart beat faster and makes you sweat.  We recommend exercising at least 30 minutes per day at least 3 days a week, preferably 4 or 5.  We also recommend a diet low in fat and sugar.  Inactivity, poor dietary choices and obesity can cause diabetes, heart attack, stroke, and kidney damage, among others.    ALCOHOL AND SMOKING:  Women should limit their alcohol intake to no more than 7 drinks/beers/glasses of wine (combined, not each!) per week. Moderation of alcohol intake to this level decreases your risk of breast cancer and liver damage. And of course, no recreational drugs are part of a healthy lifestyle.  And absolutely no smoking or even second hand smoke. Most people know smoking can cause heart and lung diseases, but did you know it also contributes to weakening of your bones? Aging of your skin?  Yellowing of your teeth and nails?  CALCIUM AND VITAMIN D:  Adequate intake of calcium and Vitamin D are recommended.  The recommendations for exact amounts of these supplements seem to change often, but generally speaking 600 mg of calcium (either carbonate or citrate) and 800 units of Vitamin D per day seems prudent. Certain women may benefit from higher intake of Vitamin D.  If you are among these women, your doctor will have told you during your visit.  PAP SMEARS:  Pap smears, to check for cervical cancer or precancers,  have traditionally been done yearly, although recent scientific advances have shown that most women can have pap smears less often.  However, every woman still should have a physical exam from her gynecologist every year. It will include a breast check, inspection of the vulva and vagina to check for abnormal growths or skin changes, a visual exam of the cervix, and then an exam to evaluate the size and  shape of the uterus and ovaries.  And after 58 years of age, a rectal exam is indicated to check for rectal cancers. We will also provide age appropriate advice regarding health maintenance, like when you should have certain vaccines, screening for sexually transmitted diseases, bone density testing, colonoscopy, mammograms, etc.   MAMMOGRAMS:  All women over 58 years old should have a yearly mammogram. Many facilities now offer a "3D" mammogram, which may cost around $50 extra out of pocket. If possible,  we recommend you accept the option to have the 3D mammogram performed.  It both reduces the number of women who will be called back for extra views which then turn out to be normal, and it is better than the routine mammogram at detecting truly abnormal areas.    COLONOSCOPY:  Colonoscopy to screen for colon cancer is recommended for all women at age 58.  We know, you hate the idea of the prep.  We agree, BUT, having colon cancer and not knowing it is worse!!  Colon cancer so often starts as a polyp that can be seen and removed at colonscopy, which can quite literally save your life!  And if your first colonoscopy is normal and you have no family history of colon cancer, most women don't have to have it again for 10 years.  Once every ten years, you can do something that may end up saving your life, right?  We will be happy to help you get it scheduled when you are ready.  Be sure to check your insurance coverage so you understand how much it will cost.  It may be covered as a preventative service at no cost, but you should check your particular policy.

## 2017-11-09 LAB — CYTOLOGY - PAP
Diagnosis: NEGATIVE
HPV (WINDOPATH): NOT DETECTED

## 2017-11-16 ENCOUNTER — Other Ambulatory Visit (INDEPENDENT_AMBULATORY_CARE_PROVIDER_SITE_OTHER): Payer: Managed Care, Other (non HMO)

## 2017-11-16 ENCOUNTER — Telehealth: Payer: Self-pay | Admitting: Family Medicine

## 2017-11-16 DIAGNOSIS — Z23 Encounter for immunization: Secondary | ICD-10-CM

## 2017-11-16 DIAGNOSIS — M810 Age-related osteoporosis without current pathological fracture: Secondary | ICD-10-CM | POA: Diagnosis not present

## 2017-11-16 MED ORDER — DENOSUMAB 60 MG/ML ~~LOC~~ SOSY
60.0000 mg | PREFILLED_SYRINGE | Freq: Once | SUBCUTANEOUS | Status: AC
Start: 2017-11-16 — End: 2017-11-16
  Administered 2017-11-16: 60 mg via SUBCUTANEOUS

## 2017-11-16 NOTE — Telephone Encounter (Signed)
Pt called and stated that her lips was tingling and and going numb and after she had her prolia and shingrix shot, states no other symptoms, states it keeps coming and going, asked DR lalonde and he states to watch it and if it gets worse to call us back

## 2018-01-02 ENCOUNTER — Other Ambulatory Visit: Payer: Self-pay | Admitting: Family Medicine

## 2018-02-10 ENCOUNTER — Other Ambulatory Visit (INDEPENDENT_AMBULATORY_CARE_PROVIDER_SITE_OTHER): Payer: Managed Care, Other (non HMO)

## 2018-02-10 DIAGNOSIS — Z23 Encounter for immunization: Secondary | ICD-10-CM

## 2018-03-14 ENCOUNTER — Ambulatory Visit (INDEPENDENT_AMBULATORY_CARE_PROVIDER_SITE_OTHER): Payer: Managed Care, Other (non HMO) | Admitting: Family Medicine

## 2018-03-14 ENCOUNTER — Encounter: Payer: Self-pay | Admitting: Family Medicine

## 2018-03-14 VITALS — BP 140/88 | HR 88 | Temp 100.7°F | Ht 64.0 in | Wt 204.0 lb

## 2018-03-14 DIAGNOSIS — R509 Fever, unspecified: Secondary | ICD-10-CM | POA: Diagnosis not present

## 2018-03-14 DIAGNOSIS — R000 Tachycardia, unspecified: Secondary | ICD-10-CM

## 2018-03-14 DIAGNOSIS — R112 Nausea with vomiting, unspecified: Secondary | ICD-10-CM | POA: Diagnosis not present

## 2018-03-14 DIAGNOSIS — R52 Pain, unspecified: Secondary | ICD-10-CM | POA: Diagnosis not present

## 2018-03-14 LAB — POCT INFLUENZA A/B
INFLUENZA A, POC: NEGATIVE
INFLUENZA B, POC: NEGATIVE

## 2018-03-14 MED ORDER — ONDANSETRON 4 MG PO TBDP: 4.0000 mg | ORAL_TABLET | Freq: Three times a day (TID) | ORAL | 0 refills | Status: DC | PRN

## 2018-03-14 NOTE — Patient Instructions (Signed)
I suspect that you have a stomach bug, causing nausea, vomiting, change in bowels.  Your heart rate is fast due to having a fever, and possibly from dehydration. Take the zofran prescribed for nausea. Try and slowly hydrate (take gentle sips). If you develop signs of dehydration, you may need to go to the ER for IV fluids   Nausea and Vomiting, Adult Nausea is the feeling that you have an upset stomach or that you are about to vomit. Vomiting is when stomach contents are thrown up and out of the mouth as a result of nausea. Vomiting can make you feel weak and cause you to become dehydrated. Dehydration can make you feel tired and thirsty, cause you to have a dry mouth, and decrease how often you urinate. Older adults and people with other diseases or a weak disease-fighting system (immune system) are at higher risk for dehydration. It is important to treat your nausea and vomiting as told by your health care provider. Follow these instructions at home: Watch your symptoms for any changes. Tell your health care provider about them. Follow these instructions to care for yourself at home. Eating and drinking      Take an oral rehydration solution (ORS). This is a drink that is sold at pharmacies and retail stores.  Drink clear fluids slowly and in small amounts as you are able. Clear fluids include water, ice chips, low-calorie sports drinks, and fruit juice that has water added (diluted fruit juice).  Eat bland, easy-to-digest foods in small amounts as you are able. These foods include bananas, applesauce, rice, lean meats, toast, and crackers.  Avoid fluids that contain a lot of sugar or caffeine, such as energy drinks, sports drinks, and soda.  Avoid alcohol.  Avoid spicy or fatty foods. General instructions  Take over-the-counter and prescription medicines only as told by your health care provider.  Drink enough fluid to keep your urine pale yellow.  Wash your hands often using soap  and water. If soap and water are not available, use hand sanitizer.  Make sure that all people in your household wash their hands well and often.  Rest at home while you recover.  Watch your condition for any changes.  Breathe slowly and deeply when you feel nauseated.  Keep all follow-up visits as told by your health care provider. This is important. Contact a health care provider if:  Your symptoms get worse.  You have new symptoms.  You have a fever.  You cannot drink fluids without vomiting.  Your nausea does not go away after 2 days.  You feel light-headed or dizzy.  You have a headache.  You have muscle cramps.  You have a rash.  You have pain while urinating. Get help right away if:  You have pain in your chest, neck, arm, or jaw.  You feel extremely weak or you faint.  You have persistent vomiting.  You have vomit that is bright red or looks like black coffee grounds.  You have bloody or black stools or stools that look like tar.  You have a severe headache, a stiff neck, or both.  You have severe pain, cramping, or bloating in your abdomen.  You have difficulty breathing, or you are breathing very quickly.  Your heart is beating very quickly.  Your skin feels cold and clammy.  You feel confused.  You have signs of dehydration, such as: ? Dark urine, very little urine, or no urine. ? Cracked lips. ? Dry mouth. ?  Sunken eyes. ? Sleepiness. ? Weakness. These symptoms may represent a serious problem that is an emergency. Do not wait to see if the symptoms will go away. Get medical help right away. Call your local emergency services (911 in the [place] Do not drive yourself to the hospital. Summary  Nausea is the feeling that you have an upset stomach or that you are about to vomit. As nausea gets worse, it can lead to vomiting. Vomiting can make you feel weak and cause you to become dehydrated.  Follow instructions from your health care provider  about eating and drinking to prevent dehydration.  Take over-the-counter and prescription medicines only as told by your health care provider.  Contact your health care provider if your symptoms get worse, or you have new symptoms.  Keep all follow-up visits as told by your health care provider. This is important. This information is not intended to replace advice given to you by your health care provider. Make sure you discuss any questions you have with your health care provider. Document Released: 01/12/2005 Document Revised: 06/22/2017 Document Reviewed: 06/22/2017 Elsevier Interactive Patient Education  2019 [employer].

## 2018-03-14 NOTE — Progress Notes (Signed)
Chief Complaint  Patient presents with  . Emesis    started last night at midnight, HA, body aches. Last time she vomited was around 10:30 am and it was just bile. Has been sipping on water. Stools are loose. Has had cough and sinus congesition over the last 7-10 days.     Tired yesterday, needed to take a nap, but otherwise was fine until she woke up at midnight with nausea and vomiting.  Has vomited at least 2 dozen times--watery, bilious, phlegmy.  No bloody or coffee ground emesis.  2 loose/soft stools today (different than normal ,but not watery). Febrile today. +myalgias  For the last 7-10 days she has had head and sinus congestion with postnasal drainage. No longer coughing, still with PND.  +sick contact (daughter with stomach bug--saw her Saturday (before she had gotten sick, was sick yesterday). No recent travel or ABX.  PMH, PSH, SH reviewed  Outpatient Encounter Medications as of 03/14/2018  Medication Sig  . cetirizine (ZYRTEC) 10 MG tablet Take 10 mg by mouth 2 (two) times daily.   . Cholecalciferol (VITAMIN D3) 1000 units CAPS Take by mouth.   . denosumab (PROLIA) 60 MG/ML SOSY injection   . Glucosamine-Chondroitin (OSTEO BI-FLEX REGULAR STRENGTH PO) Take by mouth 2 (two) times daily.  . Multiple Vitamins-Minerals (WOMENS ONE DAILY PO) Take by mouth.  . QUDEXY XR 200 MG CS24 sprinkle capsule Take by mouth daily.  . [DISCONTINUED] ranitidine (ZANTAC) 150 MG tablet TAKE 1 TABLET BY MOUTH EVERY DAY  . albuterol (PROVENTIL HFA;VENTOLIN HFA) 108 (90 Base) MCG/ACT inhaler Inhale 2 puffs into the lungs every 6 (six) hours as needed for wheezing or shortness of breath. (Patient not taking: Reported on 11/05/2017)  . SUMAtriptan (IMITREX) 100 MG tablet    No facility-administered encounter medications on file as of 03/14/2018.    Allergies  Allergen Reactions  . Codeine   . Esomeprazole Magnesium   . Ibuprofen   . Nsaids   . Penicillins   . Tramadol Rash   ROS: No bleeding,  bruising, rashes. No ear pain.  Throat feels irritated from vomiting, not really sore.   No cough, shortness of breath. No chest pain.  Not aware of palpitations or racing heart.  GI complaints per HPI. Denies dizziness. No urinary complaints.   PHYSICAL EXAM:  BP 140/88   Pulse 88   Temp (!) 100.7 F (38.2 C) (Tympanic)   Ht 5\' 4"  (1.626 m)   Wt 204 lb (92.5 kg)   LMP 05/27/2007   BMI 35.02 kg/m    Mildly ill appearing female, in no distress HEENT: PERRL, EOMI, conjunctiva and sclera are clear. TM's and EAC's normal. Nasal mucosa is moderately edematous, with recent bleeding on the left. No purulence, some clear mucus. Sinuses are nontender. OP is clear, moist mucus membranes Neck: no lymphadenopathy or mass Heart: tachycardic, rate of approx 130 Lungs: clear bilaterally Abdomen: soft, mild epigastric tenderness, nontender elsewhere Extremities: no edema Skin: no rashes  EKG: sinus tach, rate 121.  No significant changes from last EKG 10/2016 (other than rate)  Influenza A&B negative   ASSESSMENT/PLAN:   Intractable vomiting with nausea, unspecified vomiting type - zofran prn; reviewed s/sx dehydration for which she should seek care for IV fluids - Plan: ondansetron (ZOFRAN ODT) 4 MG disintegrating tablet  Body aches - Plan: Influenza A/B  Fever, unspecified fever cause - Plan: Influenza A/B  Tachycardia - sinus tach, related to fever and dehydration. tylenol and hydration discussed - Plan: EKG  12-Lead  Resolving URI. No evidence of bacterial infection based on today's exam. Suspect viral gastroenteritis.   She drove herself, so phenergan not given. Reviewed risks/SE zofran.

## 2018-03-15 ENCOUNTER — Encounter: Payer: Self-pay | Admitting: Family Medicine

## 2018-04-06 ENCOUNTER — Other Ambulatory Visit: Payer: Self-pay | Admitting: Family Medicine

## 2018-04-06 DIAGNOSIS — Z1231 Encounter for screening mammogram for malignant neoplasm of breast: Secondary | ICD-10-CM

## 2018-05-23 ENCOUNTER — Other Ambulatory Visit: Payer: Self-pay

## 2018-05-23 ENCOUNTER — Other Ambulatory Visit: Payer: Managed Care, Other (non HMO)

## 2018-05-23 DIAGNOSIS — M810 Age-related osteoporosis without current pathological fracture: Secondary | ICD-10-CM | POA: Diagnosis not present

## 2018-05-23 MED ORDER — DENOSUMAB 60 MG/ML ~~LOC~~ SOSY
60.0000 mg | PREFILLED_SYRINGE | Freq: Once | SUBCUTANEOUS | Status: AC
  Administered 2018-05-23: 09:00:00 60 mg via SUBCUTANEOUS

## 2018-06-14 ENCOUNTER — Ambulatory Visit
Admission: RE | Admit: 2018-06-14 | Discharge: 2018-06-14 | Disposition: A | Discharge status: Home / Self Care | Payer: Managed Care, Other (non HMO) | Source: Ambulatory Visit | Attending: Family Medicine | Admitting: Family Medicine

## 2018-06-14 ENCOUNTER — Ambulatory Visit: Payer: Managed Care, Other (non HMO)

## 2018-06-14 ENCOUNTER — Other Ambulatory Visit: Payer: Self-pay

## 2018-06-14 DIAGNOSIS — Z1231 Encounter for screening mammogram for malignant neoplasm of breast: Secondary | ICD-10-CM

## 2018-08-05 ENCOUNTER — Encounter: Payer: Self-pay | Admitting: Family Medicine

## 2018-09-13 ENCOUNTER — Encounter: Payer: Self-pay | Admitting: Family Medicine

## 2018-09-15 ENCOUNTER — Ambulatory Visit (INDEPENDENT_AMBULATORY_CARE_PROVIDER_SITE_OTHER): Payer: Managed Care, Other (non HMO) | Admitting: Family Medicine

## 2018-09-15 ENCOUNTER — Encounter: Payer: Self-pay | Admitting: Family Medicine

## 2018-09-15 ENCOUNTER — Other Ambulatory Visit: Payer: Self-pay

## 2018-09-15 VITALS — BP 126/88 | HR 82 | Temp 98.4°F | Wt 213.8 lb

## 2018-09-15 DIAGNOSIS — R7303 Prediabetes: Secondary | ICD-10-CM

## 2018-09-15 DIAGNOSIS — Z833 Family history of diabetes mellitus: Secondary | ICD-10-CM

## 2018-09-15 DIAGNOSIS — Z131 Encounter for screening for diabetes mellitus: Secondary | ICD-10-CM

## 2018-09-15 DIAGNOSIS — R351 Nocturia: Secondary | ICD-10-CM

## 2018-09-15 DIAGNOSIS — R350 Frequency of micturition: Secondary | ICD-10-CM

## 2018-09-15 LAB — POCT GLYCOSYLATED HEMOGLOBIN (HGB A1C): Hemoglobin A1C: 5.8 % — AB (ref 4.0–5.6)

## 2018-09-15 LAB — POCT URINALYSIS DIP (PROADVANTAGE DEVICE)
Bilirubin, UA: NEGATIVE
Blood, UA: NEGATIVE
Glucose, UA: NEGATIVE mg/dL
Ketones, POC UA: NEGATIVE mg/dL
Leukocytes, UA: NEGATIVE
Nitrite, UA: NEGATIVE
Protein Ur, POC: NEGATIVE mg/dL
Specific Gravity, Urine: 1.005
Urobilinogen, Ur: NEGATIVE
pH, UA: 7.5 (ref 5.0–8.0)

## 2018-09-15 LAB — POCT CBG (FASTING - GLUCOSE)-MANUAL ENTRY: Glucose Fasting, POC: 103 mg/dL — AB (ref 70–99)

## 2018-09-15 MED ORDER — NITROFURANTOIN MONOHYD MACRO 100 MG PO CAPS: 100.0000 mg | ORAL_CAPSULE | Freq: Two times a day (BID) | ORAL | 0 refills | Status: DC

## 2018-09-15 NOTE — Patient Instructions (Addendum)
Take the antibiotic as prescribed.  Continue to keep yourself hydrated but consider cutting off your fluids earlier in the evening before bedtime.  Make sure you urinate before going to bed.  Your hemoglobin A1c is 5.8% and shows that you do have prediabetes.  Prediabetes range is 5.7-6.4%  Make sure you are limiting your sugar and carbohydrates and increasing physical activity to prevent this from worsening.  Follow-up next week and let me know how you are doing.     Kegel Exercises  Kegel exercises can help strengthen your pelvic floor muscles. The pelvic floor is a group of muscles that support your rectum, small intestine, and bladder. In females, pelvic floor muscles also help support the womb (uterus). These muscles help you control the flow of urine and stool. Kegel exercises are painless and simple, and they do not require any equipment. Your provider may suggest Kegel exercises to:  Improve bladder and bowel control.  Improve sexual response.  Improve weak pelvic floor muscles after surgery to remove the uterus (hysterectomy) or pregnancy (females).  Improve weak pelvic floor muscles after prostate gland removal or surgery (males). Kegel exercises involve squeezing your pelvic floor muscles, which are the same muscles you squeeze when you try to stop the flow of urine or keep from passing gas. The exercises can be done while sitting, standing, or lying down, but it is best to vary your position. Exercises How to do Kegel exercises: 1. Squeeze your pelvic floor muscles tight. You should feel a tight lift in your rectal area. If you are a female, you should also feel a tightness in your vaginal area. Keep your stomach, buttocks, and legs relaxed. 2. Hold the muscles tight for up to 10 seconds. 3. Breathe normally. 4. Relax your muscles. 5. Repeat as told by your health care provider. Repeat this exercise daily as told by your health care provider. Continue to do this exercise  for at least 4-6 weeks, or for as long as told by your health care provider. You may be referred to a physical therapist who can help you learn more about how to do Kegel exercises. Depending on your condition, your health care provider may recommend:  Varying how long you squeeze your muscles.  Doing several sets of exercises every day.  Doing exercises for several weeks.  Making Kegel exercises a part of your regular exercise routine. This information is not intended to replace advice given to you by your health care provider. Make sure you discuss any questions you have with your health care provider. Document Released: 12/30/2011 Document Revised: 09/01/2017 Document Reviewed: 09/01/2017 Elsevier Patient Education  2020 [employer].

## 2018-09-15 NOTE — Progress Notes (Signed)
   Subjective:    Patient ID: Kayla Lewis, female    DOB: Oct 07, 1960, 58 y.o.   MRN: [id number]  HPI Chief Complaint  Patient presents with  . UTI    possible UTI- urinary frequency, no burning. drank cranberry tuesday and feeling some better.    Here with complaints of a 5 day history of gradually improving urinary frequency. States this was an acute onset.  Reports urinating several times during the day and waking up at night 4-5 times. States she has urinated 5 times since 4 am (5 hour period).   Denies dysuria, blood, odor or difficulty emptying. Occasional stress incontinence.   Denies fever, chills, dizziness, chest pain, palpitations, shortness of breath, back pain, abdominal pain, N/V/D, LE edema.   No history of diabetes. Her mother did have diabetes.   No increase of caffeine or alcohol.  Rarely drinks soda.   History of tubal ligation.   Her father died last week.  She is also looking for a job. Lost her job in May.   Reviewed allergies, medications, past medical, surgical, family, and social history.   Review of Systems Pertinent positives and negatives in the history of present illness.     Objective:   Physical Exam BP 126/88   Pulse 82   Temp 98.4 F (36.9 C)   Wt 213 lb 12.8 oz (97 kg)   LMP 05/27/2007   BMI 36.70 kg/m   Alert and oriented and in no distress.  Neck is supple without adenopathy or thyromegaly. Cardiac exam shows a regular sinus rhythm without murmurs or gallops. Lungs are clear to auscultation. Abdomen soft, non distended, normal BS, non tender, no palpable masses.  No CVAT. Extremities without edema. Skin is warm and dry.        Assessment & Plan:  Urine frequency - Plan: POCT Urinalysis DIP (Proadvantage Device), Glucose (CBG), Fasting, HgB A1c, nitrofurantoin, macrocrystal-monohydrate, (MACROBID) 100 MG capsule.  Urinalysis dipstick is negative.  I will treat her due to the symptoms she is having to see if these improve.   Screening for diabetes mellitus-urinary frequency with normal UA.  Family history of diabetes mellitus in mother-discussed that this does increase her risk of developing diabetes down the road.  Nocturia-may be related to UTI since this was an acute onset.  We will need to follow-up when she completes the antibiotic.  Prediabetes - Plan: CBC with Differential/Platelet, Comprehensive metabolic panel, TSH, T4, free.  Counseling on improving diet by cutting back on sugar and carbohydrates and increasing physical activity.  Discussed using a free app such as my fitness pal to help track calories and carbohydrates.  Recommend that she get no more than 60 carbs per meal and no more than 15 carbs for snacks.

## 2018-09-16 LAB — CBC WITH DIFFERENTIAL/PLATELET
Basophils Absolute: 0 10*3/uL (ref 0.0–0.2)
Basos: 1 %
EOS (ABSOLUTE): 0.2 10*3/uL (ref 0.0–0.4)
Eos: 3 %
Hematocrit: 42.9 % (ref 34.0–46.6)
Hemoglobin: 14.6 g/dL (ref 11.1–15.9)
Immature Grans (Abs): 0 10*3/uL (ref 0.0–0.1)
Immature Granulocytes: 0 %
Lymphocytes Absolute: 2.3 10*3/uL (ref 0.7–3.1)
Lymphs: 42 %
MCH: 28.9 pg (ref 26.6–33.0)
MCHC: 34 g/dL (ref 31.5–35.7)
MCV: 85 fL (ref 79–97)
Monocytes Absolute: 0.4 10*3/uL (ref 0.1–0.9)
Monocytes: 7 %
Neutrophils Absolute: 2.6 10*3/uL (ref 1.4–7.0)
Neutrophils: 47 %
Platelets: 284 10*3/uL (ref 150–450)
RBC: 5.06 x10E6/uL (ref 3.77–5.28)
RDW: 13.1 % (ref 11.7–15.4)
WBC: 5.4 10*3/uL (ref 3.4–10.8)

## 2018-09-16 LAB — COMPREHENSIVE METABOLIC PANEL
ALT: 6 IU/L (ref 0–32)
AST: 22 IU/L (ref 0–40)
Albumin/Globulin Ratio: 2.1 (ref 1.2–2.2)
Albumin: 4.7 g/dL (ref 3.8–4.9)
Alkaline Phosphatase: 93 IU/L (ref 39–117)
BUN/Creatinine Ratio: 12 (ref 9–23)
BUN: 12 mg/dL (ref 6–24)
Bilirubin Total: 0.4 mg/dL (ref 0.0–1.2)
CO2: 22 mmol/L (ref 20–29)
Calcium: 9.7 mg/dL (ref 8.7–10.2)
Chloride: 104 mmol/L (ref 96–106)
Creatinine, Ser: 0.99 mg/dL (ref 0.57–1.00)
GFR calc Af Amer: 73 mL/min/{1.73_m2} (ref >59)
GFR calc non Af Amer: 63 mL/min/{1.73_m2} (ref >59)
Globulin, Total: 2.2 g/dL (ref 1.5–4.5)
Glucose: 96 mg/dL (ref 65–99)
Potassium: 4.9 mmol/L (ref 3.5–5.2)
Sodium: 142 mmol/L (ref 134–144)
Total Protein: 6.9 g/dL (ref 6.0–8.5)

## 2018-09-16 LAB — T4, FREE: Free T4: 1.16 ng/dL (ref 0.82–1.77)

## 2018-09-16 LAB — TSH: TSH: 2.88 u[IU]/mL (ref 0.450–4.500)

## 2018-09-19 ENCOUNTER — Encounter: Payer: Self-pay | Admitting: Family Medicine

## 2018-09-22 ENCOUNTER — Ambulatory Visit: Payer: Managed Care, Other (non HMO) | Admitting: Family Medicine

## 2018-11-10 ENCOUNTER — Other Ambulatory Visit: Payer: Self-pay

## 2018-11-11 ENCOUNTER — Other Ambulatory Visit: Payer: Self-pay

## 2018-11-11 ENCOUNTER — Ambulatory Visit (INDEPENDENT_AMBULATORY_CARE_PROVIDER_SITE_OTHER): Payer: Managed Care, Other (non HMO) | Admitting: Certified Nurse Midwife

## 2018-11-11 ENCOUNTER — Encounter: Payer: Self-pay | Admitting: Certified Nurse Midwife

## 2018-11-11 VITALS — BP 118/78 | HR 64 | Temp 97.2°F | Resp 16 | Ht 64.0 in | Wt 215.0 lb

## 2018-11-11 DIAGNOSIS — Z01419 Encounter for gynecological examination (general) (routine) without abnormal findings: Secondary | ICD-10-CM | POA: Diagnosis not present

## 2018-11-11 NOTE — Progress Notes (Signed)
58 y.o. G35P2002 Divorced  Caucasian Fe here for annual exam.Menopausal no HRT. Denis any vaginal bleeding or vaginal dryness. Has had urinary frequency at sporadic times. Was seen by PCP and treated with antibiotics and felt better for a while. ? Prolapse. Lost her job and her father died unexpectantly due to heart issues.Aware of weight gain of 11 pounds, but working now and eating better. New grandchild in the next few months.. No change in migraines. No other health issues today. Sees PCP for aex, labs, Osteoporosis with Prolia use and Vitamin D management.   Patient's last menstrual period was 05/27/2007.          Sexually active: No.  The current method of family planning is post menopausal status.    Exercising: No.  exercise Smoker:  no  Review of Systems  Constitutional: Negative.   HENT: Negative.   Eyes: Negative.   Respiratory: Negative.   Cardiovascular: Negative.   Gastrointestinal: Negative.   Genitourinary: Positive for frequency.       Comes & goes  Musculoskeletal: Negative.   Skin: Negative.   Neurological: Negative.   Endo/Heme/Allergies: Negative.   Psychiatric/Behavioral: Negative.     Health Maintenance: Pap:  10-17-14 neg HPV HR neg, 11-05-17 neg HPV HR neg History of Abnormal Pap: yes MMG:  06-14-2018 category c density birads 1:neg Self Breast exams: yes Colonoscopy:  2012 polyp f/u 72yrs BMD:  2018 osteoporosis with PCP management TDaP:  2017 Shingles: 2020 Pneumonia: not done Hep C and HIV: HIV neg 2017, hep c neg (care everywhere) Labs: yes with PCP   reports that she quit smoking about 20 years ago. Her smoking use included cigarettes. She has a 1.25 pack-year smoking history. She has never used smokeless tobacco. She reports that she does not drink alcohol or use drugs.  Past Medical History:  Diagnosis Date  . Bursitis    right hip  . Erosive gastritis   . Hiatal hernia   . History of abnormal Pap smear 1991  . Migraine age 51's   with aura   . Obesity (BMI 30-39.9) 11/05/2016  . Osteoporosis   . Plantar fasciitis    both feet  . Shingles outbreak 06/2008   abdomen    Past Surgical History:  Procedure Laterality Date  . BREAST EXCISIONAL BIOPSY Left 1997   benign  . BREAST SURGERY Left 1997   lumpectomy benign  . CERVICAL POLYPECTOMY  1999  . CERVIX LESION DESTRUCTION    . COLONOSCOPY W/ BIOPSIES  09/22/10   1 polyp benign recheck in 10 years.  . TONSILLECTOMY  age 32    Current Outpatient Medications  Medication Sig Dispense Refill  . albuterol (PROVENTIL HFA;VENTOLIN HFA) 108 (90 Base) MCG/ACT inhaler Inhale 2 puffs into the lungs every 6 (six) hours as needed for wheezing or shortness of breath. 18 g 0  . cetirizine (ZYRTEC) 10 MG tablet Take 10 mg by mouth 2 (two) times daily.     . Cholecalciferol (VITAMIN D3) 1000 units CAPS Take by mouth.     . denosumab (PROLIA) 60 MG/ML SOSY injection     . Glucosamine-Chondroitin (OSTEO BI-FLEX REGULAR STRENGTH PO) Take by mouth 2 (two) times daily.    . Multiple Vitamins-Minerals (WOMENS ONE DAILY PO) Take by mouth.    . QUDEXY XR 200 MG CS24 sprinkle capsule Take by mouth daily.  5  . SUMAtriptan (IMITREX) 100 MG tablet      No current facility-administered medications for this visit.  Family History  Problem Relation Age of Onset  . AAA (abdominal aortic aneurysm) Mother 81       heavy smoker   . Deep vein thrombosis Mother   . Diabetes Mother   . Heart disease Mother   . COPD Father   . Bladder Cancer Father   . Heart attack Father   . Depression Sister   . Hypertension Sister   . Obesity Sister   . Hyperlipidemia Sister   . Heart disease Sister 26       MI  . Asthma Sister   . Obesity Sister   . Breast cancer Maternal Grandmother     ROS:  Pertinent items are noted in HPI.  Otherwise, a comprehensive ROS was negative.  Exam:   BP 118/78   Pulse 64   Temp (!) 97.2 F (36.2 C) (Skin)   Resp 16   Ht 5\' 4"  (1.626 m)   Wt 215 lb (97.5 kg)    LMP 05/27/2007   BMI 36.90 kg/m  Height: 5\' 4"  (162.6 cm) Ht Readings from Last 3 Encounters:  11/11/18 5\' 4"  (1.626 m)  03/14/18 5\' 4"  (1.626 m)  11/05/17 5\' 4"  (1.626 m)    General appearance: alert, cooperative and appears stated age Head: Normocephalic, without obvious abnormality, atraumatic Neck: no adenopathy, supple, symmetrical, trachea midline and thyroid normal to inspection and palpation Lungs: clear to auscultation bilaterally Breasts: normal appearance, no masses or tenderness, No nipple retraction or dimpling, No nipple discharge or bleeding, No axillary or supraclavicular adenopathy Heart: regular rate and rhythm Abdomen: soft, non-tender; no masses,  no organomegaly Extremities: extremities normal, atraumatic, no cyanosis or edema Skin: Skin color, texture, turgor normal. No rashes or lesions Lymph nodes: Cervical, supraclavicular, and axillary nodes normal. No abnormal inguinal nodes palpated Neurologic: Grossly normal   Pelvic: External genitalia:  no lesions, slight atrophic appearance              Urethra:  normal appearing urethra with no masses, tenderness or lesions  Bladder not tender, no cystocele  Urethral meatus: prominent with slight redness (shown area in mirror to patient).              Bartholin's and Skene's: normal                 Vagina: slight atrophic appearing vagina with normal color and scant discharge, no lesions              Cervix: no cervical motion tenderness, no lesions and normal appearance              Pap taken: No. Bimanual Exam:  Uterus:  normal size, contour, position, consistency, mobility, non-tender and anteverted              Adnexa: normal adnexa and no mass, fullness, tenderness               Rectovaginal: Confirms               Anus:  normal sphincter tone, no lesions  Chaperone present: yes  A:  Well Woman with normal exam  Post menopausal no HRT  Atrophic vaginitis  Urinary frequency history with treatment with PCP,  not a problem now except at night  Osteoporosis on Prolia with PCP management  P:   Reviewed health and wellness pertinent to exam  Aware of need to advise if vaginal bleeding.  Discussed finding which may contribute to urinary frequency. OTC option of Olive oil or coconut  trial, instructions given. Also discussed Estrogen cream use if no change. Patient will advise.  Continue follow up with PCP as indicated.  Pap smear: no   counseled on breast self exam, feminine hygiene, adequate intake of calcium and vitamin D, diet and exercise.  return annually or prn  An After Visit Summary was printed and given to the patient.

## 2018-11-11 NOTE — Patient Instructions (Signed)
EXERCISE AND DIET:  We recommended that you start or continue a regular exercise program for good health. Regular exercise means any activity that makes your heart beat faster and makes you sweat.  We recommend exercising at least 30 minutes per day at least 3 days a week, preferably 4 or 5.  We also recommend a diet low in fat and sugar.  Inactivity, poor dietary choices and obesity can cause diabetes, heart attack, stroke, and kidney damage, among others.   ° °ALCOHOL AND SMOKING:  Women should limit their alcohol intake to no more than 7 drinks/beers/glasses of wine (combined, not each!) per week. Moderation of alcohol intake to this level decreases your risk of breast cancer and liver damage. And of course, no recreational drugs are part of a healthy lifestyle.  And absolutely no smoking or even second hand smoke. Most people know smoking can cause heart and lung diseases, but did you know it also contributes to weakening of your bones? Aging of your skin?  Yellowing of your teeth and nails? ° °CALCIUM AND VITAMIN D:  Adequate intake of calcium and Vitamin D are recommended.  The recommendations for exact amounts of these supplements seem to change often, but generally speaking 600 mg of calcium (either carbonate or citrate) and 800 units of Vitamin D per day seems prudent. Certain women may benefit from higher intake of Vitamin D.  If you are among these women, your doctor will have told you during your visit.   ° °PAP SMEARS:  Pap smears, to check for cervical cancer or precancers,  have traditionally been done yearly, although recent scientific advances have shown that most women can have pap smears less often.  However, every woman still should have a physical exam from her gynecologist every year. It will include a breast check, inspection of the vulva and vagina to check for abnormal growths or skin changes, a visual exam of the cervix, and then an exam to evaluate the size and shape of the uterus and  ovaries.  And after 59 years of age, a rectal exam is indicated to check for rectal cancers. We will also provide age appropriate advice regarding health maintenance, like when you should have certain vaccines, screening for sexually transmitted diseases, bone density testing, colonoscopy, mammograms, etc.  ° °MAMMOGRAMS:  All women over 40 years old should have a yearly mammogram. Many facilities now offer a "3D" mammogram, which may cost around $50 extra out of pocket. If possible,  we recommend you accept the option to have the 3D mammogram performed.  It both reduces the number of women who will be called back for extra views which then turn out to be normal, and it is better than the routine mammogram at detecting truly abnormal areas.   ° °COLONOSCOPY:  Colonoscopy to screen for colon cancer is recommended for all women at age 50.  We know, you hate the idea of the prep.  We agree, BUT, having colon cancer and not knowing it is worse!!  Colon cancer so often starts as a polyp that can be seen and removed at colonscopy, which can quite literally save your life!  And if your first colonoscopy is normal and you have no family history of colon cancer, most women don't have to have it again for 10 years.  Once every ten years, you can do something that may end up saving your life, right?  We will be happy to help you get it scheduled when you are ready.    Be sure to check your insurance coverage so you understand how much it will cost.  It may be covered as a preventative service at no cost, but you should check your particular policy.   ° ° ° °Atrophic Vaginitis °Atrophic vaginitis is a condition in which the tissues that line the vagina become dry and thin. This condition occurs in women who have stopped having their period. It is caused by a drop in a female hormone (estrogen). This hormone helps: °· To keep the vagina moist. °· To make a clear fluid. This clear fluid helps: °? To make the vagina ready for  sex. °? To protect the vagina from infection. °If the lining of the vagina is dry and thin, it may cause irritation, burning, or itchiness. It may also: °· Make sex painful. °· Make an exam of your vagina painful. °· Cause bleeding. °· Make you lose interest in sex. °· Cause a burning feeling when you pee (urinate). °· Cause a brown or yellow fluid to come from your vagina. °Some women do not have symptoms. °Follow these instructions at home: °Medicines °· Take over-the-counter and prescription medicines only as told by your doctor. °· Do not use herbs or other medicines unless your doctor says it is okay. °· Use medicines for for dryness. These include: °? Oils to make the vagina soft. °? Creams. °? Moisturizers. °General instructions °· Do not douche. °· Do not use products that can make your vagina dry. These include: °? Scented sprays. °? Scented tampons. °? Scented soaps. °· Sex can help increase blood flow and soften the tissue in the vagina. If it hurts to have sex: °? Tell your partner. °? Use products to make sex more comfortable. Use these only as told by your doctor. °Contact a doctor if you: °· Have discharge from the vagina that is different than usual. °· Have a bad smell coming from your vagina. °· Have new symptoms. °· Do not get better. °· Get worse. °Summary °· Atrophic vaginitis is a condition in which the lining of the vagina becomes dry and thin. °· This condition affects women who have stopped having their periods. °· Treatment may include using products that help make the vagina soft. °· Call a doctor if do not get better with treatment. °This information is not intended to replace advice given to you by your health care provider. Make sure you discuss any questions you have with your health care provider. °Document Released: 07/01/2007 Document Revised: 01/25/2017 Document Reviewed: 01/25/2017 °Elsevier Patient Education © 2020 [employer] Inc. ° °

## 2018-11-23 ENCOUNTER — Other Ambulatory Visit: Payer: Managed Care, Other (non HMO)

## 2018-11-23 ENCOUNTER — Other Ambulatory Visit: Payer: Self-pay

## 2018-11-23 DIAGNOSIS — M810 Age-related osteoporosis without current pathological fracture: Secondary | ICD-10-CM | POA: Diagnosis not present

## 2018-11-23 MED ORDER — DENOSUMAB 60 MG/ML ~~LOC~~ SOSY
60.0000 mg | PREFILLED_SYRINGE | Freq: Once | SUBCUTANEOUS | Status: AC
Start: 2018-11-23 — End: 2018-11-23
  Administered 2018-11-23: 60 mg via SUBCUTANEOUS

## 2019-02-13 ENCOUNTER — Encounter: Payer: Self-pay | Admitting: Family Medicine

## 2019-04-17 ENCOUNTER — Encounter: Payer: Self-pay | Admitting: Certified Nurse Midwife

## 2019-04-17 ENCOUNTER — Ambulatory Visit (INDEPENDENT_AMBULATORY_CARE_PROVIDER_SITE_OTHER): Payer: Managed Care, Other (non HMO) | Admitting: Family Medicine

## 2019-04-17 ENCOUNTER — Other Ambulatory Visit: Payer: Self-pay

## 2019-04-17 ENCOUNTER — Encounter: Payer: Self-pay | Admitting: Family Medicine

## 2019-04-17 VITALS — BP 128/80 | HR 81 | Wt 213.0 lb

## 2019-04-17 DIAGNOSIS — M810 Age-related osteoporosis without current pathological fracture: Secondary | ICD-10-CM | POA: Diagnosis not present

## 2019-04-17 DIAGNOSIS — E559 Vitamin D deficiency, unspecified: Secondary | ICD-10-CM

## 2019-04-17 DIAGNOSIS — R7303 Prediabetes: Secondary | ICD-10-CM

## 2019-04-17 NOTE — Progress Notes (Signed)
   Subjective:  Documentation for virtual audio and video telecommunications through [employer] encounter:  The patient was located at  work. 2 patient identifiers used.  The provider was located in the office. The patient did consent to this visit and is aware of possible charges through their insurance for this visit.  The other persons participating in this telemedicine service were none.    Patient ID: Kayla Lewis, female    DOB: 1960/11/24, 59 y.o.   MRN: [id number]  HPI Chief Complaint  Patient presents with  . follow-up    prolia follow-up, no reactions and doing shot every 6 months   This is a follow up on osteoporosis.  States she went through menopause in her late 66s.   At her initial visit with me in 09/2016, she reported having osteoporosis in her hip, diagnosed in 2013 and states she had tried more than one oral medication, bisphosphonates, but had joint pain with them. States she could not tolerate them. She had been approved for but had not started on Prolia.   Last reported DEXA in June 2018 but I do not have these results.   States she is doing well on Prolia, no side effects. Is due for recheck DEXA.   Labs done 09/15/2018  Vitamin D 29.3 ng/mL and she is taking a supplement.    She saw cardiology recently and had a 0 coronary CT calcium score Other providers: Cardiologist- Dr. [place]  GI- Dr. Loreta Ave  OB/GYN- Dr. [facility].  Dermatologist- Dr. Doreen Beam   Prediabetes- 5.8% on 09/15/2018 She will return for a recheck soon.  Has not been as active.  She has a new job now, works at [employer] as Production designer, theatre/television/film, payroll  Denies fever, chills, hot flashes, chest pain, palpitations, shortness of breath, abdominal pain, N/V/D.    Review of Systems Pertinent positives and negatives in the history of present illness.     Objective:   Physical Exam BP 128/80   Pulse 81   Wt 213 lb (96.6 kg)   LMP 05/27/2007   BMI 36.56 kg/m   Alert and  oriented and in no acute distress.  Respirations unlabored.  Normal speech, mood and thought process      Assessment & Plan:  Age-related osteoporosis without current pathological fracture  Prediabetes  Vitamin D deficiency  This visit is to follow-up on osteoporosis and continuation of Prolia. Last bone density result I have on file is 2013 however, she reports having a bone density done in 2018 and I do not have these results. She will call and schedule a bone density exam at the breast center.  She will also call and schedule her mammogram when it is due and she may do these at the same time. Recommend continuing on vitamin D supplement, getting adequate calcium in her diet and weightbearing exercise. She will come in in the next 2 months for a CPE.  We will need to recheck her A1c and vitamin D level   Time spent on call was 12 minutes and in review of previous records 15 minutes total.  This virtual service is not related to other E/M service within previous 7 days.

## 2019-05-02 ENCOUNTER — Encounter: Payer: Self-pay | Admitting: Family Medicine

## 2019-05-02 DIAGNOSIS — M810 Age-related osteoporosis without current pathological fracture: Secondary | ICD-10-CM

## 2019-05-02 DIAGNOSIS — E559 Vitamin D deficiency, unspecified: Secondary | ICD-10-CM

## 2019-05-03 ENCOUNTER — Other Ambulatory Visit: Payer: Self-pay | Admitting: Family Medicine

## 2019-05-03 DIAGNOSIS — Z1231 Encounter for screening mammogram for malignant neoplasm of breast: Secondary | ICD-10-CM

## 2019-05-04 ENCOUNTER — Telehealth: Payer: Self-pay | Admitting: Family Medicine

## 2019-05-04 NOTE — Telephone Encounter (Signed)
Pt states that June was the first avaiable with [facility]. They didn't have any opening left. She was going to check at [facility] where she now works to see if they have a bone density machine and can get it sooner and she will let us know

## 2019-05-04 NOTE — Telephone Encounter (Signed)
Is she is already scheduled for June for bone density

## 2019-05-04 NOTE — Telephone Encounter (Signed)
Called pt and informed her that Prolia was denied by her insurance company. Also informed her per Vickie that she would here back from [facility] to determine what next steps would be. Pt can be reached at [phone]

## 2019-05-04 NOTE — Telephone Encounter (Signed)
Please find out when she has the bone density scheduled. I think having this done and knowing if she is benefiting from the medication or not would help [place] get it approved through her insurance.

## 2019-05-04 NOTE — Telephone Encounter (Signed)
She may want to see if she can get the bone density sooner so that I can go to bat for her with her insurance company.

## 2019-05-05 ENCOUNTER — Encounter: Payer: Self-pay | Admitting: Family Medicine

## 2019-05-05 ENCOUNTER — Other Ambulatory Visit: Payer: Self-pay | Admitting: Internal Medicine

## 2019-05-05 DIAGNOSIS — M810 Age-related osteoporosis without current pathological fracture: Secondary | ICD-10-CM

## 2019-05-08 ENCOUNTER — Encounter: Payer: Self-pay | Admitting: Family Medicine

## 2019-05-08 ENCOUNTER — Ambulatory Visit
Admission: RE | Admit: 2019-05-08 | Discharge: 2019-05-08 | Disposition: A | Discharge status: Home / Self Care | Payer: Managed Care, Other (non HMO) | Source: Ambulatory Visit | Attending: Family Medicine | Admitting: Family Medicine

## 2019-05-08 DIAGNOSIS — M810 Age-related osteoporosis without current pathological fracture: Secondary | ICD-10-CM | POA: Insufficient documentation

## 2019-06-16 ENCOUNTER — Other Ambulatory Visit: Payer: Self-pay

## 2019-06-16 ENCOUNTER — Ambulatory Visit
Admission: RE | Admit: 2019-06-16 | Discharge: 2019-06-16 | Disposition: A | Discharge status: Home / Self Care | Payer: Managed Care, Other (non HMO) | Source: Ambulatory Visit

## 2019-06-16 DIAGNOSIS — Z1231 Encounter for screening mammogram for malignant neoplasm of breast: Secondary | ICD-10-CM

## 2019-07-14 ENCOUNTER — Other Ambulatory Visit: Payer: Managed Care, Other (non HMO)

## 2019-11-17 ENCOUNTER — Ambulatory Visit: Payer: Managed Care, Other (non HMO) | Admitting: Certified Nurse Midwife

## 2019-11-20 ENCOUNTER — Ambulatory Visit (INDEPENDENT_AMBULATORY_CARE_PROVIDER_SITE_OTHER): Payer: Managed Care, Other (non HMO) | Admitting: Obstetrics and Gynecology

## 2019-11-20 ENCOUNTER — Other Ambulatory Visit: Payer: Self-pay

## 2019-11-20 ENCOUNTER — Encounter: Payer: Self-pay | Admitting: Obstetrics and Gynecology

## 2019-11-20 VITALS — BP 122/78 | HR 72 | Resp 14 | Ht 64.0 in | Wt 214.0 lb

## 2019-11-20 DIAGNOSIS — Z01419 Encounter for gynecological examination (general) (routine) without abnormal findings: Secondary | ICD-10-CM

## 2019-11-20 DIAGNOSIS — Z0000 Encounter for general adult medical examination without abnormal findings: Secondary | ICD-10-CM | POA: Diagnosis not present

## 2019-11-20 DIAGNOSIS — R7303 Prediabetes: Secondary | ICD-10-CM

## 2019-11-20 DIAGNOSIS — E559 Vitamin D deficiency, unspecified: Secondary | ICD-10-CM

## 2019-11-20 DIAGNOSIS — Z6836 Body mass index (BMI) 36.0-36.9, adult: Secondary | ICD-10-CM

## 2019-11-20 DIAGNOSIS — Z8739 Personal history of other diseases of the musculoskeletal system and connective tissue: Secondary | ICD-10-CM | POA: Diagnosis not present

## 2019-11-20 DIAGNOSIS — N952 Postmenopausal atrophic vaginitis: Secondary | ICD-10-CM

## 2019-11-20 NOTE — Patient Instructions (Signed)
EXERCISE AND DIET:  We recommended that you start or continue a regular exercise program for good health. Regular exercise means any activity that makes your heart beat faster and makes you sweat.  We recommend exercising at least 30 minutes per day at least 3 days a week, preferably 4 or 5.  We also recommend a diet low in fat and sugar.  Inactivity, poor dietary choices and obesity can cause diabetes, heart attack, stroke, and kidney damage, among others.    ALCOHOL AND SMOKING:  Women should limit their alcohol intake to no more than 7 drinks/beers/glasses of wine (combined, not each!) per week. Moderation of alcohol intake to this level decreases your risk of breast cancer and liver damage. And of course, no recreational drugs are part of a healthy lifestyle.  And absolutely no smoking or even second hand smoke. Most people know smoking can cause heart and lung diseases, but did you know it also contributes to weakening of your bones? Aging of your skin?  Yellowing of your teeth and nails?  CALCIUM AND VITAMIN D:  Adequate intake of calcium and Vitamin D are recommended.  The recommendations for exact amounts of these supplements seem to change often, but generally speaking 1,200 mg of calcium (between diet and supplement) and 800 units of Vitamin D per day seems prudent. Certain women may benefit from higher intake of Vitamin D.  If you are among these women, your doctor will have told you during your visit.    PAP SMEARS:  Pap smears, to check for cervical cancer or precancers,  have traditionally been done yearly, although recent scientific advances have shown that most women can have pap smears less often.  However, every woman still should have a physical exam from her gynecologist every year. It will include a breast check, inspection of the vulva and vagina to check for abnormal growths or skin changes, a visual exam of the cervix, and then an exam to evaluate the size and shape of the uterus and  ovaries.  And after 60 years of age, a rectal exam is indicated to check for rectal cancers. We will also provide age appropriate advice regarding health maintenance, like when you should have certain vaccines, screening for sexually transmitted diseases, bone density testing, colonoscopy, mammograms, etc.   MAMMOGRAMS:  All women over 40 years old should have a yearly mammogram. Many facilities now offer a "3D" mammogram, which may cost around $50 extra out of pocket. If possible,  we recommend you accept the option to have the 3D mammogram performed.  It both reduces the number of women who will be called back for extra views which then turn out to be normal, and it is better than the routine mammogram at detecting truly abnormal areas.    COLON CANCER SCREENING: Now recommend starting at age 45. At this time colonoscopy is not covered for routine screening until 50. There are take home tests that can be done between 45-49.   COLONOSCOPY:  Colonoscopy to screen for colon cancer is recommended for all women at age 50.  We know, you hate the idea of the prep.  We agree, BUT, having colon cancer and not knowing it is worse!!  Colon cancer so often starts as a polyp that can be seen and removed at colonscopy, which can quite literally save your life!  And if your first colonoscopy is normal and you have no family history of colon cancer, most women don't have to have it again for   10 years.  Once every ten years, you can do something that may end up saving your life, right?  We will be happy to help you get it scheduled when you are ready.  Be sure to check your insurance coverage so you understand how much it will cost.  It may be covered as a preventative service at no cost, but you should check your particular policy.      Breast Self-Awareness Breast self-awareness means being familiar with how your breasts look and feel. It involves checking your breasts regularly and reporting any changes to your  health care provider. Practicing breast self-awareness is important. A change in your breasts can be a sign of a serious medical problem. Being familiar with how your breasts look and feel allows you to find any problems early, when treatment is more likely to be successful. All women should practice breast self-awareness, including women who have had breast implants. How to do a breast self-exam One way to learn what is normal for your breasts and whether your breasts are changing is to do a breast self-exam. To do a breast self-exam: Look for Changes  1. Remove all the clothing above your waist. 2. Stand in front of a mirror in a room with good lighting. 3. Put your hands on your hips. 4. Push your hands firmly downward. 5. Compare your breasts in the mirror. Look for differences between them (asymmetry), such as: ? Differences in shape. ? Differences in size. ? Puckers, dips, and bumps in one breast and not the other. 6. Look at each breast for changes in your skin, such as: ? Redness. ? Scaly areas. 7. Look for changes in your nipples, such as: ? Discharge. ? Bleeding. ? Dimpling. ? Redness. ? A change in position. Feel for Changes Carefully feel your breasts for lumps and changes. It is best to do this while lying on your back on the floor and again while sitting or standing in the shower or tub with soapy water on your skin. Feel each breast in the following way:  Place the arm on the side of the breast you are examining above your head.  Feel your breast with the other hand.  Start in the nipple area and make  inch (2 cm) overlapping circles to feel your breast. Use the pads of your three middle fingers to do this. Apply light pressure, then medium pressure, then firm pressure. The light pressure will allow you to feel the tissue closest to the skin. The medium pressure will allow you to feel the tissue that is a little deeper. The firm pressure will allow you to feel the tissue  close to the ribs.  Continue the overlapping circles, moving downward over the breast until you feel your ribs below your breast.  Move one finger-width toward the center of the body. Continue to use the  inch (2 cm) overlapping circles to feel your breast as you move slowly up toward your collarbone.  Continue the up and down exam using all three pressures until you reach your armpit.  Write Down What You Find  Write down what is normal for each breast and any changes that you find. Keep a written record with breast changes or normal findings for each breast. By writing this information down, you do not need to depend only on memory for size, tenderness, or location. Write down where you are in your menstrual cycle, if you are still menstruating. If you are having trouble noticing differences   in your breasts, do not get discouraged. With time you will become more familiar with the variations in your breasts and more comfortable with the exam. How often should I examine my breasts? Examine your breasts every month. If you are breastfeeding, the best time to examine your breasts is after a feeding or after using a breast pump. If you menstruate, the best time to examine your breasts is 5-7 days after your period is over. During your period, your breasts are lumpier, and it may be more difficult to notice changes. When should I see my health care provider? See your health care provider if you notice:  A change in shape or size of your breasts or nipples.  A change in the skin of your breast or nipples, such as a reddened or scaly area.  Unusual discharge from your nipples.  A lump or thick area that was not there before.  Pain in your breasts.  Anything that concerns you.  Atrophic Vaginitis  Atrophic vaginitis is a condition in which the tissues that line the vagina become dry and thin. This condition is most common in women who have stopped having regular menstrual periods (are in  menopause). This usually starts when a woman is 45-55 years old. That is the time when a woman's estrogen levels begin to drop (decrease). Estrogen is a female hormone. It helps to keep the tissues of the vagina moist. It stimulates the vagina to produce a clear fluid that lubricates the vagina for sexual intercourse. This fluid also protects the vagina from infection. Lack of estrogen can cause the lining of the vagina to get thinner and dryer. The vagina may also shrink in size. It may become less elastic. Atrophic vaginitis tends to get worse over time as a woman's estrogen level drops. What are the causes? This condition is caused by the normal drop in estrogen that happens around the time of menopause. What increases the risk? Certain conditions or situations may lower a woman's estrogen level, leading to a higher risk for atrophic vaginitis. You are more likely to develop this condition if:  You are taking medicines that block estrogen.  You have had your ovaries removed.  You are being treated for cancer with X-ray (radiation) or medicines (chemotherapy).  You have given birth or are breastfeeding.  You are older than age 50.  You smoke. What are the signs or symptoms? Symptoms of this condition include:  Pain, soreness, or bleeding during sexual intercourse (dyspareunia).  Vaginal burning, irritation, or itching.  Pain or bleeding when a speculum is used in a vaginal exam (pelvic exam).  Having burning pain when passing urine.  Vaginal discharge that is brown or yellow. In some cases, there are no symptoms. How is this diagnosed? This condition is diagnosed by taking a medical history and doing a physical exam. This will include a pelvic exam that checks the vaginal tissues. Though rare, you may also have other tests, including:  A urine test.  A test that checks the acid balance in your vagina (acid balance test). How is this treated? Treatment for this condition  depends on how severe your symptoms are. Treatment may include:  Using an over-the-counter vaginal lubricant before sex.  Using a long-acting vaginal moisturizer.  Using low-dose vaginal estrogen for moderate to severe symptoms that do not respond to other treatments. Options include creams, tablets, and inserts (vaginal rings). Before you use a vaginal estrogen, tell your health care provider if you have a history of: ?   Breast cancer. ? Endometrial cancer. ? Blood clots. If you are not sexually active and your symptoms are very mild, you may not need treatment. Follow these instructions at home: Medicines  Take over-the-counter and prescription medicines only as told by your health care provider. Do not use herbal or alternative medicines unless your health care provider says that you can.  Use over-the-counter creams, lubricants, or moisturizers for dryness only as directed by your health care provider. General instructions  If your atrophic vaginitis is caused by menopause, discuss all of your menopause symptoms and treatment options with your health care provider.  Do not douche.  Do not use products that can make your vagina dry. These include: ? Scented feminine sprays. ? Scented tampons. ? Scented soaps.  Vaginal intercourse can help to improve blood flow and elasticity of vaginal tissue. If it hurts to have sex, try using a lubricant or moisturizer just before having intercourse. Contact a health care provider if:  Your discharge looks different than normal.  Your vagina has an unusual smell.  You have new symptoms.  Your symptoms do not improve with treatment.  Your symptoms get worse. Summary  Atrophic vaginitis is a condition in which the tissues that line the vagina become dry and thin. It is most common in women who have stopped having regular menstrual periods (are in menopause).  Treatment options include using vaginal lubricants and low-dose vaginal  estrogen.  Contact a health care provider if your vagina has an unusual smell, or if your symptoms get worse or do not improve after treatment. This information is not intended to replace advice given to you by your health care provider. Make sure you discuss any questions you have with your health care provider. Document Revised: 12/25/2016 Document Reviewed: 10/08/2016 Elsevier Patient Education  2020 [employer] Inc.  

## 2019-11-20 NOTE — Progress Notes (Signed)
59 y.o. G25P2002 Divorced White or Caucasian Not Hispanic or Latino female here for annual exam. patient would like to discuss an "itchy place" under her right arm. Per patient also would like to discuss "stingy feeling" when she washes vaginal area with soap.   Not sexually active, no vaginal bleeding. No bowel or bladder issues.   She has an itchy spot in her right axilla in the last few days.     Patient's last menstrual period was 05/27/2007.          Sexually active: No.  The current method of family planning is post menopausal status.    Exercising: No.  The patient does not participate in regular exercise at present. Smoker:  no  Health Maintenance: Pap:  11-05-17 neg HPV HR neg  10-17-14 neg HPV HR neg History of abnormal Pap:  Yes, 30 years ago she had treatment of dysplasia. Normal since.  MMG:  06/19/19 Density C Bi-rads 1 neg  BMD:   05/08/19 osteopenic, T score -1.9, previously on Prolia. Went off of prolia a year ago.  Colonoscopy: 09/22/10 F/U 10 years  TDaP:  09/25/15   Gardasil: NA   reports that she quit smoking about 21 years ago. Her smoking use included cigarettes. She has a 1.25 pack-year smoking history. She has never used smokeless tobacco. She reports that she does not drink alcohol and does not use drugs. She works in Marine scientist at [employer]. 14 year old son Kayla Lewis), 62 year old daughter (local). 52.87 year old and 47 month old grandsons locally.   Past Medical History:  Diagnosis Date  . Bursitis    right hip  . Erosive gastritis   . Hiatal hernia   . History of abnormal Pap smear 1991  . 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 Obesity (BMI 30-39.9) 11/05/2016  . Osteopenia determined by x-ray 10/25/2014  . Plantar fasciitis    both feet  . Shingles outbreak 06/2008   abdomen    Past Surgical History:  Procedure Laterality Date  . BREAST EXCISIONAL BIOPSY Left 1997   benign  . BREAST SURGERY Left 1997   lumpectomy benign  . CERVICAL POLYPECTOMY  1999   . CERVIX LESION DESTRUCTION    . COLONOSCOPY W/ BIOPSIES  09/22/10   1 polyp benign recheck in 10 years.  . TONSILLECTOMY  age 20    Current Outpatient Medications  Medication Sig Dispense Refill  . albuterol (PROVENTIL HFA;VENTOLIN HFA) 108 (90 Base) MCG/ACT inhaler Inhale 2 puffs into the lungs every 6 (six) hours as needed for wheezing or shortness of breath. 18 g 0  . Calcium Carbonate-Vit D-Min (CALTRATE MINIS PLUS MINERALS) 300-800 MG-UNIT TABS Take by mouth.    . cetirizine (ZYRTEC) 10 MG tablet Take 10 mg by mouth 2 (two) times daily.     . Cholecalciferol (VITAMIN D3) 1000 units CAPS Take by mouth.     . CRANBERRY PO Take by mouth.    . Cyanocobalamin 1000 MCG TBCR Take by mouth.    . Glucosamine-Chondroitin (OSTEO BI-FLEX REGULAR STRENGTH PO) Take by mouth 2 (two) times daily.    . Magnesium (V-R MAGNESIUM) 250 MG TABS Take by mouth.    . Multiple Vitamins-Minerals (WOMENS ONE DAILY PO) Take by mouth.    . SUMAtriptan (IMITREX) 100 MG tablet     . topiramate (TOPAMAX) 200 MG tablet Take by mouth.     No current facility-administered medications for this visit.    Family History  Problem  Relation Age of Onset  . AAA (abdominal aortic aneurysm) Mother 68       heavy smoker   . Deep vein thrombosis Mother   . Diabetes Mother   . Heart disease Mother   . COPD Father   . Bladder Cancer Father   . Heart attack Father   . Depression Sister   . Hypertension Sister   . Obesity Sister   . Hyperlipidemia Sister   . Heart disease Sister 49       MI  . Asthma Sister   . Obesity Sister   . Breast cancer Maternal Grandmother     Review of Systems  Constitutional: Negative.   HENT: Negative.   Eyes: Negative.   Respiratory: Negative.   Cardiovascular: Negative.   Gastrointestinal: Negative.   Endocrine: Negative.   Genitourinary: Negative.   Musculoskeletal: Negative.   Skin: Negative.   Allergic/Immunologic: Negative.   Neurological: Negative.   Hematological:  Negative.   Psychiatric/Behavioral: Negative.     Exam:   BP 122/78 (BP Location: Right Arm, Patient Position: Sitting, Cuff Size: Large)   Pulse 72   Resp 14   Ht 5\' 4"  (1.626 m)   Wt 214 lb (97.1 kg)   LMP 05/27/2007   BMI 36.73 kg/m   Weight change: @WEIGHTCHANGE @ Height:   Height: 5\' 4"  (162.6 cm)  Ht Readings from Last 3 Encounters:  11/20/19 5\' 4"  (1.626 m)  11/11/18 5\' 4"  (1.626 m)  03/14/18 5\' 4"  (1.626 m)    General appearance: alert, cooperative and appears stated age Head: Normocephalic, without obvious abnormality, atraumatic Neck: no adenopathy, supple, symmetrical, trachea midline and thyroid normal to inspection and palpation Lungs: clear to auscultation bilaterally Cardiovascular: regular rate and rhythm Breasts: normal appearance, no masses or tenderness Abdomen: soft, non-tender; non distended,  no masses,  no organomegaly Extremities: extremities normal, atraumatic, no cyanosis or edema Skin: Skin color, texture, turgor normal. No rashes or lesions Lymph nodes: Cervical, supraclavicular, and axillary nodes normal. No abnormal inguinal nodes palpated Neurologic: Grossly normal   Pelvic: External genitalia:  no lesions              Urethra:  normal appearing urethra with no masses, tenderness or lesions              Bartholins and Skenes: normal                 Vagina: atrophic appearing vagina with normal color and discharge, no lesions              Cervix: no lesions               Bimanual Exam:  Uterus:  normal size, contour, position, consistency, mobility, non-tender              Adnexa: no mass, fullness, tenderness               Rectovaginal: Confirms               Anus:  normal sphincter tone, no lesions  Kayla Lewis chaperoned for the exam.  A:  Well Woman with normal exam  Osteopenia, managed by primary  Prediabetes  BMI 36  Vit d def  Vaginal atrophy, vulvar skin care sheet given. Use hypoallergenic, non scented soaps.   P:   No pap  this year  Pap UTD  Discussed breast self exam  Discussed calcium and vit D intake  Screening labs, HgbA1C, vit D, TSH  Mammogram UTD  Colonoscopy next year  DEXA with primary

## 2019-11-21 LAB — CBC
Hematocrit: 40 % (ref 34.0–46.6)
Hemoglobin: 13.1 g/dL (ref 11.1–15.9)
MCH: 28.5 pg (ref 26.6–33.0)
MCHC: 32.8 g/dL (ref 31.5–35.7)
MCV: 87 fL (ref 79–97)
Platelets: 313 10*3/uL (ref 150–450)
RBC: 4.6 x10E6/uL (ref 3.77–5.28)
RDW: 13.6 % (ref 11.7–15.4)
WBC: 5.6 10*3/uL (ref 3.4–10.8)

## 2019-11-21 LAB — LIPID PANEL
Chol/HDL Ratio: 5.3 ratio — ABNORMAL HIGH (ref 0.0–4.4)
Cholesterol, Total: 208 mg/dL — ABNORMAL HIGH (ref 100–199)
HDL: 39 mg/dL — ABNORMAL LOW (ref >39)
LDL Chol Calc (NIH): 119 mg/dL — ABNORMAL HIGH (ref 0–99)
Triglycerides: 284 mg/dL — ABNORMAL HIGH (ref 0–149)
VLDL Cholesterol Cal: 50 mg/dL — ABNORMAL HIGH (ref 5–40)

## 2019-11-21 LAB — COMPREHENSIVE METABOLIC PANEL
ALT: 8 IU/L (ref 0–32)
AST: 17 IU/L (ref 0–40)
Albumin/Globulin Ratio: 2 (ref 1.2–2.2)
Albumin: 4.4 g/dL (ref 3.8–4.9)
Alkaline Phosphatase: 155 IU/L — ABNORMAL HIGH (ref 44–121)
BUN/Creatinine Ratio: 15 (ref 9–23)
BUN: 15 mg/dL (ref 6–24)
Bilirubin Total: <0.2 mg/dL (ref 0.0–1.2)
CO2: 24 mmol/L (ref 20–29)
Calcium: 9.5 mg/dL (ref 8.7–10.2)
Chloride: 105 mmol/L (ref 96–106)
Creatinine, Ser: 0.97 mg/dL (ref 0.57–1.00)
GFR calc Af Amer: 74 mL/min/{1.73_m2} (ref >59)
GFR calc non Af Amer: 64 mL/min/{1.73_m2} (ref >59)
Globulin, Total: 2.2 g/dL (ref 1.5–4.5)
Glucose: 92 mg/dL (ref 65–99)
Potassium: 3.7 mmol/L (ref 3.5–5.2)
Sodium: 141 mmol/L (ref 134–144)
Total Protein: 6.6 g/dL (ref 6.0–8.5)

## 2019-11-21 LAB — VITAMIN D 25 HYDROXY (VIT D DEFICIENCY, FRACTURES): Vit D, 25-Hydroxy: 22.5 ng/mL — ABNORMAL LOW (ref 30.0–100.0)

## 2019-11-21 LAB — HEMOGLOBIN A1C
Est. average glucose Bld gHb Est-mCnc: 128 mg/dL
Hgb A1c MFr Bld: 6.1 % — ABNORMAL HIGH (ref 4.8–5.6)

## 2019-11-21 LAB — TSH: TSH: 3.25 u[IU]/mL (ref 0.450–4.500)

## 2019-11-23 ENCOUNTER — Telehealth: Payer: Self-pay | Admitting: Internal Medicine

## 2019-11-23 NOTE — Telephone Encounter (Signed)
-----   Message from Avanell Shackleton, NP-C sent at 11/23/2019  2:44 PM EDT ----- Looks like she is overdue for visit with me ----- Message ----- From: Leda Min, RN Sent: 11/23/2019   2:11 PM EDT To: Avanell Shackleton, NP-C  Patient needs f/u for abnormal lipid panel.

## 2019-11-23 NOTE — Telephone Encounter (Signed)
Left message for pt to call back and schedule a visit with vickie to discuss labs

## 2020-01-20 ENCOUNTER — Encounter: Payer: Self-pay | Admitting: Family Medicine

## 2020-01-22 ENCOUNTER — Encounter: Payer: Self-pay | Admitting: Family Medicine

## 2020-01-22 ENCOUNTER — Other Ambulatory Visit: Payer: Self-pay

## 2020-01-22 ENCOUNTER — Telehealth (INDEPENDENT_AMBULATORY_CARE_PROVIDER_SITE_OTHER): Payer: Managed Care, Other (non HMO) | Admitting: Family Medicine

## 2020-01-22 VITALS — Temp 98.0°F | Wt 212.0 lb

## 2020-01-22 DIAGNOSIS — J069 Acute upper respiratory infection, unspecified: Secondary | ICD-10-CM | POA: Diagnosis not present

## 2020-01-22 DIAGNOSIS — R059 Cough, unspecified: Secondary | ICD-10-CM

## 2020-01-22 MED ORDER — PROMETHAZINE-DM 6.25-15 MG/5ML PO SYRP: 5.0000 mL | ORAL_SOLUTION | Freq: Every evening | ORAL | 0 refills | Status: DC | PRN

## 2020-01-22 NOTE — Progress Notes (Signed)
   Subjective:  Documentation for virtual audio and video telecommunications through Caregility encounter:  The patient was located at home. 2 patient identifiers used.  The provider was located in the office. The patient did consent to this visit and is aware of possible charges through their insurance for this visit.  The other persons participating in this telemedicine service were none. Time spent on call was 15 minutes and in review of previous records >20 minutes total.  This virtual service is not related to other E/M service within previous 7 days.   Patient ID: Kayla Lewis, female    DOB: January 20, 1961, 59 y.o.   MRN: [id number]  HPI Chief Complaint  Patient presents with  . other    Started about 8 days ago head congestion, cough, moved to her chest, drainage 75% better than last week never ran a fever, covid test wed. Negative PCR TEST   Complains of an 8 day history of nasal congestion, rhinorrhea and now has a cough and chest congestion. States she is coughing during the day and at night. Being kept awake from coughing. Cough is not particularly productive and sputum is not thick.   States she actually feels about 75% improved and nasal congestion and rhinorrhea have basically resolved.  No fever, chills, body aches, headache, dizziness, chest pain, palpitations, shortness of breath, wheezing, abdominal pain, nausea, vomiting or diarrhea.  Saline spray, Tylenol, Mucinex, Advil Cold and Sinus.   She is fully vaccinated for Covid.   Her grandsons were also sick with cold symptoms, not Covid per patient.    Review of Systems Pertinent positives and negatives in the history of present illness.     Objective:   Physical Exam Temp 98 F (36.7 C)   Wt 212 lb (96.2 kg)   LMP 05/27/2007   BMI 36.39 kg/m   Alert and oriented in no acute distress.  Speaking in complete sentences without difficulty.      Assessment & Plan:  Upper respiratory infection with cough  and congestion - Plan: promethazine-dextromethorphan (PROMETHAZINE-DM) 6.25-15 MG/5ML syrup  Cough - Plan: promethazine-dextromethorphan (PROMETHAZINE-DM) 6.25-15 MG/5ML syrup  She is reportedly 75% improved and her main symptom now is cough, predominantly dry, which is keeping her awake at night. Fully vaccinated for Covid and negative Covid PCR test. Discussed that she appears to have a viral infection and we will continue with symptomatic treatment. Recommend good hydration, switching to Mucinex DM and may stop Advil Cold and Sinus. I will also prescribe Promethazine DM for her to take at bedtime and discussed that this medication may sedate her. She will let me know if she is worsening or not improving significantly in the next 2 to 3 days.

## 2020-05-13 ENCOUNTER — Other Ambulatory Visit: Payer: Self-pay | Admitting: Family Medicine

## 2020-05-13 DIAGNOSIS — Z1231 Encounter for screening mammogram for malignant neoplasm of breast: Secondary | ICD-10-CM

## 2020-06-25 ENCOUNTER — Encounter: Payer: Self-pay | Admitting: Family Medicine

## 2020-06-27 ENCOUNTER — Other Ambulatory Visit: Payer: Self-pay

## 2020-06-27 ENCOUNTER — Ambulatory Visit
Admission: RE | Admit: 2020-06-27 | Discharge: 2020-06-27 | Disposition: A | Discharge status: Home / Self Care | Payer: BC Managed Care – PPO | Source: Ambulatory Visit

## 2020-06-27 DIAGNOSIS — Z1231 Encounter for screening mammogram for malignant neoplasm of breast: Secondary | ICD-10-CM | POA: Diagnosis not present

## 2020-06-28 DIAGNOSIS — D2372 Other benign neoplasm of skin of left lower limb, including hip: Secondary | ICD-10-CM | POA: Diagnosis not present

## 2020-06-28 DIAGNOSIS — D224 Melanocytic nevi of scalp and neck: Secondary | ICD-10-CM | POA: Diagnosis not present

## 2020-06-28 DIAGNOSIS — L814 Other melanin hyperpigmentation: Secondary | ICD-10-CM | POA: Diagnosis not present

## 2020-06-28 DIAGNOSIS — D225 Melanocytic nevi of trunk: Secondary | ICD-10-CM | POA: Diagnosis not present

## 2020-06-28 DIAGNOSIS — B078 Other viral warts: Secondary | ICD-10-CM | POA: Diagnosis not present

## 2020-07-02 ENCOUNTER — Other Ambulatory Visit: Payer: Self-pay | Admitting: Family Medicine

## 2020-07-02 DIAGNOSIS — R928 Other abnormal and inconclusive findings on diagnostic imaging of breast: Secondary | ICD-10-CM

## 2020-07-04 DIAGNOSIS — Z1211 Encounter for screening for malignant neoplasm of colon: Secondary | ICD-10-CM | POA: Diagnosis not present

## 2020-07-04 DIAGNOSIS — R635 Abnormal weight gain: Secondary | ICD-10-CM | POA: Diagnosis not present

## 2020-07-04 DIAGNOSIS — E739 Lactose intolerance, unspecified: Secondary | ICD-10-CM | POA: Diagnosis not present

## 2020-07-12 DIAGNOSIS — G43111 Migraine with aura, intractable, with status migrainosus: Secondary | ICD-10-CM | POA: Diagnosis not present

## 2020-07-12 DIAGNOSIS — G43719 Chronic migraine without aura, intractable, without status migrainosus: Secondary | ICD-10-CM | POA: Diagnosis not present

## 2020-07-12 DIAGNOSIS — G43019 Migraine without aura, intractable, without status migrainosus: Secondary | ICD-10-CM | POA: Diagnosis not present

## 2020-07-23 ENCOUNTER — Ambulatory Visit: Payer: BC Managed Care – PPO

## 2020-07-23 ENCOUNTER — Ambulatory Visit
Admission: RE | Admit: 2020-07-23 | Discharge: 2020-07-23 | Disposition: A | Discharge status: Home / Self Care | Payer: BC Managed Care – PPO | Source: Ambulatory Visit | Attending: Family Medicine | Admitting: Family Medicine

## 2020-07-23 ENCOUNTER — Other Ambulatory Visit: Payer: Self-pay

## 2020-07-23 DIAGNOSIS — R928 Other abnormal and inconclusive findings on diagnostic imaging of breast: Secondary | ICD-10-CM | POA: Diagnosis not present

## 2020-07-23 DIAGNOSIS — R922 Inconclusive mammogram: Secondary | ICD-10-CM | POA: Diagnosis not present

## 2020-08-15 DIAGNOSIS — L509 Urticaria, unspecified: Secondary | ICD-10-CM | POA: Diagnosis not present

## 2020-09-27 DIAGNOSIS — K621 Rectal polyp: Secondary | ICD-10-CM | POA: Diagnosis not present

## 2020-09-27 DIAGNOSIS — Z1211 Encounter for screening for malignant neoplasm of colon: Secondary | ICD-10-CM | POA: Diagnosis not present

## 2020-09-27 DIAGNOSIS — D128 Benign neoplasm of rectum: Secondary | ICD-10-CM | POA: Diagnosis not present

## 2020-09-27 LAB — HM COLONOSCOPY

## 2020-10-04 ENCOUNTER — Encounter: Payer: Self-pay | Admitting: Family Medicine

## 2020-10-04 DIAGNOSIS — K621 Rectal polyp: Secondary | ICD-10-CM | POA: Insufficient documentation

## 2020-11-25 NOTE — Progress Notes (Signed)
60 y.o. G52P2002 Divorced White or Caucasian Not Hispanic or Latino female here for annual exam.  Patient is having right lower back pain for a few days. She is also having urinary frequency and urinary urgency. Voiding normal amounts, takes a while to empty. She does feel empty. No pain. No leakage.   No vaginal bleeding.  No bowel issues.   Patient's last menstrual period was 05/27/2007.          Sexually active: No.  The current method of family planning is post menopausal status.    Exercising: Yes.     15 min on a treadmil  Smoker:  no  Health Maintenance: Pap:   11-05-17 neg HPV HR neg             10-17-14 neg HPV HR neg History of abnormal Pap:  Yes, 30 years ago she had treatment of dysplasia. Normal since.  MMG:  06/27/20 density C incomplete  Left tomo Bi rads 1 neg  BMD:   05/08/19 osteopenic, T score -1.9 Colonoscopy: 09/27/20 F/U 10 years  TDaP:  09/25/15  Gardasil: none    reports that she quit smoking about 22 years ago. Her smoking use included cigarettes. She has a 1.25 pack-year smoking history. She has never used smokeless tobacco. She reports that she does not drink alcohol and does not use drugs. She works in Marine scientist at [employer]. 20 year old son Claris Gower), 50 year old daughter (local). 50.67 year old and 33.1 year old grandsons locally. Daughter is having her 3rd child (another boy) in January.   Past Medical History:  Diagnosis Date   Bursitis    right hip   Erosive gastritis    Hiatal hernia    History of abnormal Pap smear 1991   Migraine age 27's   with aura   Obesity (BMI 30-39.9) 11/05/2016   Osteopenia determined by x-ray 10/25/2014   Plantar fasciitis    both feet   Prediabetes    Shingles outbreak 06/2008   abdomen    Past Surgical History:  Procedure Laterality Date   BREAST EXCISIONAL BIOPSY Left 1997   benign   BREAST SURGERY Left 1997   lumpectomy benign   CERVICAL POLYPECTOMY  1999   CERVIX LESION DESTRUCTION     COLONOSCOPY W/  BIOPSIES  09/22/10   1 polyp benign recheck in 10 years.   TONSILLECTOMY  age 1    Current Outpatient Medications  Medication Sig Dispense Refill   Calcium Carbonate-Vit D-Min (CALTRATE MINIS PLUS MINERALS) 300-800 MG-UNIT TABS Take by mouth.     cetirizine (ZYRTEC) 10 MG tablet Take 10 mg by mouth 2 (two) times daily.      Cholecalciferol (VITAMIN D3) 1000 units CAPS Take by mouth.      CRANBERRY PO Take by mouth.     Cyanocobalamin 1000 MCG TBCR Take by mouth.     Glucosamine-Chondroitin (OSTEO BI-FLEX REGULAR STRENGTH PO) Take by mouth 2 (two) times daily.     Magnesium 250 MG TABS Take by mouth.     Multiple Vitamins-Minerals (WOMENS ONE DAILY PO) Take by mouth.     promethazine-dextromethorphan (PROMETHAZINE-DM) 6.25-15 MG/5ML syrup Take 5 mLs by mouth at bedtime as needed for cough. 118 mL 0   SUMAtriptan (IMITREX) 100 MG tablet      ZONISAMIDE PO Take 150 mg by mouth daily.     No current facility-administered medications for this visit.    Family History  Problem Relation Age of Onset   AAA (  abdominal aortic aneurysm) Mother 70       heavy smoker    Deep vein thrombosis Mother    Diabetes Mother    Heart disease Mother    COPD Father    Bladder Cancer Father    Heart attack Father    Depression Sister    Hypertension Sister    Obesity Sister    Hyperlipidemia Sister    Heart disease Sister 89       MI   Asthma Sister    Obesity Sister     Review of Systems  Genitourinary:  Positive for flank pain and frequency.   Exam:   BP 138/80   Pulse (!) 111   Ht 5\' 4"  (1.626 m)   Wt 227 lb (103 kg)   LMP 05/27/2007   SpO2 97%   BMI 38.96 kg/m   Weight change: @WEIGHTCHANGE @ Height:   Height: 5\' 4"  (162.6 cm)  Ht Readings from Last 3 Encounters:  11/27/20 5\' 4"  (1.626 m)  11/20/19 5\' 4"  (1.626 m)  11/11/18 5\' 4"  (1.626 m)    General appearance: alert, cooperative and appears stated age Head: Normocephalic, without obvious abnormality, atraumatic Neck: no  adenopathy, supple, symmetrical, trachea midline and thyroid normal to inspection and palpation Lungs: clear to auscultation bilaterally Cardiovascular: regular rate and rhythm Breasts: normal appearance, no masses or tenderness Abdomen: soft, non-tender; non distended,  no masses,  no organomegaly Extremities: extremities normal, atraumatic, no cyanosis or edema Skin: Skin color, texture, turgor normal. No rashes or lesions Lymph nodes: Cervical, supraclavicular, and axillary nodes normal. No abnormal inguinal nodes palpated Neurologic: Grossly normal   Pelvic: External genitalia:  no lesions              Urethra:  normal appearing urethra with no masses, tenderness or lesions              Bartholins and Skenes: normal                 Vagina: atrophic appearing vagina with normal color and discharge, no lesions              Cervix: no lesions               Bimanual Exam:  Uterus:   no masses or tenderness              Adnexa: no mass, fullness, tenderness               Rectovaginal: Confirms               Anus:  normal sphincter tone, no lesions  13/02/22 chaperoned for the exam.  1. Well woman exam Discussed breast self exam Discussed calcium and vit D intake Mammogram UTD Colonoscopy UTD Needs to get fasting labs with her primary  2. Acute right-sided low back pain without sciatica Currently feeling better  3. Urinary frequency - Urinalysis,Complete w/RFL Culture  4. History of osteopenia Needs DEXA in 4/23  5. Hypoestrogenism DEXA  6 Prediabetes She will f/u with her primary Discussed exercise and eating healthy Information given

## 2020-11-27 ENCOUNTER — Encounter: Payer: Self-pay | Admitting: Obstetrics and Gynecology

## 2020-11-27 ENCOUNTER — Ambulatory Visit (INDEPENDENT_AMBULATORY_CARE_PROVIDER_SITE_OTHER): Payer: BC Managed Care – PPO | Admitting: Obstetrics and Gynecology

## 2020-11-27 ENCOUNTER — Other Ambulatory Visit: Payer: Self-pay

## 2020-11-27 VITALS — BP 138/80 | HR 111 | Ht 64.0 in | Wt 227.0 lb

## 2020-11-27 DIAGNOSIS — M545 Low back pain, unspecified: Secondary | ICD-10-CM | POA: Diagnosis not present

## 2020-11-27 DIAGNOSIS — G43719 Chronic migraine without aura, intractable, without status migrainosus: Secondary | ICD-10-CM | POA: Diagnosis not present

## 2020-11-27 DIAGNOSIS — G43111 Migraine with aura, intractable, with status migrainosus: Secondary | ICD-10-CM | POA: Diagnosis not present

## 2020-11-27 DIAGNOSIS — R7303 Prediabetes: Secondary | ICD-10-CM

## 2020-11-27 DIAGNOSIS — Z8739 Personal history of other diseases of the musculoskeletal system and connective tissue: Secondary | ICD-10-CM

## 2020-11-27 DIAGNOSIS — E2839 Other primary ovarian failure: Secondary | ICD-10-CM

## 2020-11-27 DIAGNOSIS — R350 Frequency of micturition: Secondary | ICD-10-CM | POA: Diagnosis not present

## 2020-11-27 DIAGNOSIS — Z01419 Encounter for gynecological examination (general) (routine) without abnormal findings: Secondary | ICD-10-CM

## 2020-11-27 DIAGNOSIS — G43019 Migraine without aura, intractable, without status migrainosus: Secondary | ICD-10-CM | POA: Diagnosis not present

## 2020-11-27 NOTE — Patient Instructions (Addendum)
You need fasting labs with your primary. Make sure to get a vit d level (history of a low vit d) and HgbA1C (looking for Diabetes).   Acute Back Pain, Adult Acute back pain is sudden and usually short-lived. It is often caused by an injury to the muscles and tissues in the back. The injury may result from: A muscle, tendon, or ligament getting overstretched or torn. Ligaments are tissues that connect bones to each other. Lifting something improperly can cause a back strain. Wear and tear (degeneration) of the spinal disks. Spinal disks are circular tissue that provide cushioning between the bones of the spine (vertebrae). Twisting motions, such as while playing sports or doing yard work. A hit to the back. Arthritis. You may have a physical exam, lab tests, and imaging tests to find the cause of your pain. Acute back pain usually goes away with rest and home care. Follow these instructions at home: Managing pain, stiffness, and swelling Take over-the-counter and prescription medicines only as told by your health care provider. Treatment may include medicines for pain and inflammation that are taken by mouth or applied to the skin, or muscle relaxants. Your health care provider may recommend applying ice during the first 24-48 hours after your pain starts. To do this: Put ice in a plastic bag. Place a towel between your skin and the bag. Leave the ice on for 20 minutes, 2-3 times a day. Remove the ice if your skin turns bright red. This is very important. If you cannot feel pain, heat, or cold, you have a greater risk of damage to the area. If directed, apply heat to the affected area as often as told by your health care provider. Use the heat source that your health care provider recommends, such as a moist heat pack or a heating pad. Place a towel between your skin and the heat source. Leave the heat on for 20-30 minutes. Remove the heat if your skin turns bright red. This is especially  important if you are unable to feel pain, heat, or cold. You have a greater risk of getting burned. Activity  Do not stay in bed. Staying in bed for more than 1-2 days can delay your recovery. Sit up and stand up straight. Avoid leaning forward when you sit or hunching over when you stand. If you work at a desk, sit close to it so you do not need to lean over. Keep your chin tucked in. Keep your neck drawn back, and keep your elbows bent at a 90-degree angle (right angle). Sit high and close to the steering wheel when you drive. Add lower back (lumbar) support to your car seat, if needed. Take short walks on even surfaces as soon as you are able. Try to increase the length of time you walk each day. Do not sit, drive, or stand in one place for more than 30 minutes at a time. Sitting or standing for long periods of time can put stress on your back. Do not drive or use heavy machinery while taking prescription pain medicine. Use proper lifting techniques. When you bend and lift, use positions that put less stress on your back: [place] your knees. Keep the load close to your body. Avoid twisting. Exercise regularly as told by your health care provider. Exercising helps your back heal faster and helps prevent back injuries by keeping muscles strong and flexible. Work with a physical therapist to make a safe exercise program, as recommended by your health care provider.  Do any exercises as told by your physical therapist. Lifestyle Maintain a healthy weight. Extra weight puts stress on your back and makes it difficult to have good posture. Avoid activities or situations that make you feel anxious or stressed. Stress and anxiety increase muscle tension and can make back pain worse. Learn ways to manage anxiety and stress, such as through exercise. General instructions Sleep on a firm mattress in a comfortable position. Try lying on your side with your knees slightly bent. If you lie on your back, put a  pillow under your knees. Keep your head and neck in a straight line with your spine (neutral position) when using electronic equipment like smartphones or pads. To do this: Raise your smartphone or pad to look at it instead of bending your head or neck to look down. Put the smartphone or pad at the level of your face while looking at the screen. Follow your treatment plan as told by your health care provider. This may include: Cognitive or behavioral therapy. Acupuncture or massage therapy. Meditation or yoga. Contact a health care provider if: You have pain that is not relieved with rest or medicine. You have increasing pain going down into your legs or buttocks. Your pain does not improve after 2 weeks. You have pain at night. You lose weight without trying. You have a fever or chills. You develop nausea or vomiting. You develop abdominal pain. Get help right away if: You develop new bowel or bladder control problems. You have unusual weakness or numbness in your arms or legs. You feel faint. These symptoms may represent a serious problem that is an emergency. Do not wait to see if the symptoms will go away. Get medical help right away. Call your local emergency services (911 in the [place] Do not drive yourself to the hospital. Summary Acute back pain is sudden and usually short-lived. Use proper lifting techniques. When you bend and lift, use positions that put less stress on your back. Take over-the-counter and prescription medicines only as told by your health care provider, and apply heat or ice as told. This information is not intended to replace advice given to you by your health care provider. Make sure you discuss any questions you have with your health care provider. Document Revised: 04/05/2020 Document Reviewed: 04/05/2020 Elsevier Patient Education  2022 [employer] Inc. Prediabetes Eating Plan Prediabetes is a condition that causes blood sugar (glucose) levels to be higher  than normal. This increases the risk for developing type 2 diabetes (type 2 diabetes mellitus). Working with a health care provider or nutrition specialist (dietitian) to make diet and lifestyle changes can help prevent the onset of diabetes. These changes may help you: Control your blood glucose levels. Improve your cholesterol levels. Manage your blood pressure. What are tips for following this plan? Reading food labels Read food labels to check the amount of fat, salt (sodium), and sugar in prepackaged foods. Avoid foods that have: Saturated fats. Trans fats. Added sugars. Avoid foods that have more than 300 milligrams (mg) of sodium per serving. Limit your sodium intake to less than 2,300 mg each day. Shopping Avoid buying pre-made and processed foods. Avoid buying drinks with added sugar. Cooking Cook with olive oil. Do not use butter, lard, or ghee. Bake, broil, grill, steam, or boil foods. Avoid frying. Meal planning  Work with your dietitian to create an eating plan that is right for you. This may include tracking how many calories you take in each day. Use a  food diary, notebook, or mobile application to track what you eat at each meal. Consider following a Mediterranean diet. This includes: Eating several servings of fresh fruits and vegetables each day. Eating fish at least twice a week. Eating one serving each day of whole grains, beans, nuts, and seeds. Using olive oil instead of other fats. Limiting alcohol. Limiting red meat. Using nonfat or low-fat dairy products. Consider following a plant-based diet. This includes dietary choices that focus on eating mostly vegetables and fruit, grains, beans, nuts, and seeds. If you have high blood pressure, you may need to limit your sodium intake or follow a diet such as the DASH (Dietary Approaches to Stop Hypertension) eating plan. The DASH diet aims to lower high blood pressure. Lifestyle Set weight loss goals with help from  your health care team. It is recommended that most people with prediabetes lose 7% of their body weight. Exercise for at least 30 minutes 5 or more days a week. Attend a support group or seek support from a mental health counselor. Take over-the-counter and prescription medicines only as told by your health care provider. What foods are recommended? Fruits Berries. Bananas. Apples. Oranges. Grapes. Papaya. Mango. Pomegranate. Kiwi. Grapefruit. Cherries. Vegetables Lettuce. Spinach. Peas. Beets. Cauliflower. Cabbage. Broccoli. Carrots. Tomatoes. Squash. Eggplant. Herbs. Peppers. Onions. Cucumbers. Brussels sprouts. Grains Whole grains, such as whole-wheat or whole-grain breads, crackers, cereals, and pasta. Unsweetened oatmeal. Bulgur. Barley. Quinoa. Brown rice. Corn or whole-wheat flour tortillas or taco shells. Meats and other proteins Seafood. Poultry without skin. Lean cuts of pork and beef. Tofu. Eggs. Nuts. Beans. Dairy Low-fat or fat-free dairy products, such as yogurt, cottage cheese, and cheese. Beverages Water. Tea. Coffee. Sugar-free or diet soda. Seltzer water. Low-fat or nonfat milk. Milk alternatives, such as soy or almond milk. Fats and oils Olive oil. Canola oil. Sunflower oil. Grapeseed oil. Avocado. Walnuts. Sweets and desserts Sugar-free or low-fat pudding. Sugar-free or low-fat ice cream and other frozen treats. Seasonings and condiments Herbs. Sodium-free spices. Mustard. Relish. Low-salt, low-sugar ketchup. Low-salt, low-sugar barbecue sauce. Low-fat or fat-free mayonnaise. The items listed above may not be a complete list of recommended foods and beverages. Contact a dietitian for more information. What foods are not recommended? Fruits Fruits canned with syrup. Vegetables Canned vegetables. Frozen vegetables with butter or cream sauce. Grains Refined white flour and flour products, such as bread, pasta, snack foods, and cereals. Meats and other proteins Fatty  cuts of meat. Poultry with skin. Breaded or fried meat. Processed meats. Dairy Full-fat yogurt, cheese, or milk. Beverages Sweetened drinks, such as iced tea and soda. Fats and oils Butter. Lard. Ghee. Sweets and desserts Baked goods, such as cake, cupcakes, pastries, cookies, and cheesecake. Seasonings and condiments Spice mixes with added salt. Ketchup. Barbecue sauce. Mayonnaise. The items listed above may not be a complete list of foods and beverages that are not recommended. Contact a dietitian for more information. Where to find more information American Diabetes Association: www.diabetes.org Summary You may need to make diet and lifestyle changes to help prevent the onset of diabetes. These changes can help you control blood sugar, improve cholesterol levels, and manage blood pressure. Set weight loss goals with help from your health care team. It is recommended that most people with prediabetes lose 7% of their body weight. Consider following a Mediterranean diet. This includes eating plenty of fresh fruits and vegetables, whole grains, beans, nuts, seeds, fish, and low-fat dairy, and using olive oil instead of other fats. This information is not  intended to replace advice given to you by your health care provider. Make sure you discuss any questions you have with your health care provider. Document Revised: 04/13/2019 Document Reviewed: 04/13/2019 Elsevier Patient Education  2022 Elsevier Inc.   EXERCISE   We recommended that you start or continue a regular exercise program for good health. Physical activity is anything that gets your body moving, some is better than none. The CDC recommends 150 minutes per week of Moderate-Intensity Aerobic Activity and 2 or more days of Muscle Strengthening Activity.  Benefits of exercise are limitless: helps weight loss/weight maintenance, improves mood and energy, helps with depression and anxiety, improves sleep, tones and strengthens muscles,  improves balance, improves bone density, protects from chronic conditions such as heart disease, high blood pressure and diabetes and so much more. To learn more visit: http://kirby-bean.org/  DIET: Good nutrition starts with a healthy diet of fruits, vegetables, whole grains, and lean protein sources. Drink plenty of water for hydration. Minimize empty calories, sodium, sweets. For more information about dietary recommendations visit: CriticalGas.be and https://www.carpenter-henry.info/  ALCOHOL:  Women should limit their alcohol intake to no more than 7 drinks/beers/glasses of wine (combined, not each!) per week. Moderation of alcohol intake to this level decreases your risk of breast cancer and liver damage.  If you are concerned that you may have a problem, or your friends have told you they are concerned about your drinking, there are many resources to help. A well-known program that is free, effective, and available to all people all over the nation is Alcoholics Anonymous.  Check out this site to learn more: BeverageBargains.co.za   CALCIUM AND VITAMIN D:  Adequate intake of calcium and Vitamin D are recommended for bone health.  You should be getting between 1000-1200 mg of calcium and 800 units of Vitamin D daily between diet and supplements  PAP SMEARS:  Pap smears, to check for cervical cancer or precancers,  have traditionally been done yearly, scientific advances have shown that most women can have pap smears less often.  However, every woman still should have a physical exam from her gynecologist every year. It will include a breast check, inspection of the vulva and vagina to check for abnormal growths or skin changes, a visual exam of the cervix, and then an exam to evaluate the size and shape of the uterus and ovaries. We will also provide age appropriate advice regarding health maintenance, like when you should have  certain vaccines, screening for sexually transmitted diseases, bone density testing, colonoscopy, mammograms, etc.   MAMMOGRAMS:  All women over 34 years old should have a routine mammogram.   COLON CANCER SCREENING: Now recommend starting at age 33. At this time colonoscopy is not covered for routine screening until 50. There are take home tests that can be done between 45-49.   COLONOSCOPY:  Colonoscopy to screen for colon cancer is recommended for all women at age 67.  We know, you hate the idea of the prep.  We agree, BUT, having colon cancer and not knowing it is worse!!  Colon cancer so often starts as a polyp that can be seen and removed at colonscopy, which can quite literally save your life!  And if your first colonoscopy is normal and you have no family history of colon cancer, most women don't have to have it again for 10 years.  Once every ten years, you can do something that may end up saving your life, right?  We will be happy to  help you get it scheduled when you are ready.  Be sure to check your insurance coverage so you understand how much it will cost.  It may be covered as a preventative service at no cost, but you should check your particular policy.      Breast Self-Awareness Breast self-awareness means being familiar with how your breasts look and feel. It involves checking your breasts regularly and reporting any changes to your health care provider. Practicing breast self-awareness is important. A change in your breasts can be a sign of a serious medical problem. Being familiar with how your breasts look and feel allows you to find any problems early, when treatment is more likely to be successful. All women should practice breast self-awareness, including women who have had breast implants. How to do a breast self-exam One way to learn what is normal for your breasts and whether your breasts are changing is to do a breast self-exam. To do a breast self-exam: Look for  Changes  Remove all the clothing above your waist. Stand in front of a mirror in a room with good lighting. Put your hands on your hips. Push your hands firmly downward. Compare your breasts in the mirror. Look for differences between them (asymmetry), such as: Differences in shape. Differences in size. Puckers, dips, and bumps in one breast and not the other. Look at each breast for changes in your skin, such as: Redness. Scaly areas. Look for changes in your nipples, such as: Discharge. Bleeding. [place]pling. Redness. A change in position. Feel for Changes Carefully feel your breasts for lumps and changes. It is best to do this while lying on your back on the floor and again while sitting or standing in the shower or tub with soapy water on your skin. Feel each breast in the following way: Place the arm on the side of the breast you are examining above your head. Feel your breast with the other hand. Start in the nipple area and make  inch (2 cm) overlapping circles to feel your breast. Use the pads of your three middle fingers to do this. Apply light pressure, then medium pressure, then firm pressure. The light pressure will allow you to feel the tissue closest to the skin. The medium pressure will allow you to feel the tissue that is a little deeper. The firm pressure will allow you to feel the tissue close to the ribs. Continue the overlapping circles, moving downward over the breast until you feel your ribs below your breast. Move one finger-width toward the center of the body. Continue to use the  inch (2 cm) overlapping circles to feel your breast as you move slowly up toward your collarbone. Continue the up and down exam using all three pressures until you reach your armpit.  Write Down What You Find  Write down what is normal for each breast and any changes that you find. Keep a written record with breast changes or normal findings for each breast. By writing this information  down, you do not need to depend only on memory for size, tenderness, or location. Write down where you are in your menstrual cycle, if you are still menstruating. If you are having trouble noticing differences in your breasts, do not get discouraged. With time you will become more familiar with the variations in your breasts and more comfortable with the exam. How often should I examine my breasts? Examine your breasts every month. If you are breastfeeding, the best time to examine your  breasts is after a feeding or after using a breast pump. If you menstruate, the best time to examine your breasts is 5-7 days after your period is over. During your period, your breasts are lumpier, and it may be more difficult to notice changes. When should I see my health care provider? See your health care provider if you notice: A change in shape or size of your breasts or nipples. A change in the skin of your breast or nipples, such as a reddened or scaly area. Unusual discharge from your nipples. A lump or thick area that was not there before. Pain in your breasts. Anything that concerns you.

## 2020-11-29 LAB — URINALYSIS, COMPLETE W/RFL CULTURE
Bacteria, UA: NONE SEEN /HPF
Bilirubin Urine: NEGATIVE
Glucose, UA: NEGATIVE
Hgb urine dipstick: NEGATIVE
Hyaline Cast: NONE SEEN /LPF
Ketones, ur: NEGATIVE
Nitrites, Initial: NEGATIVE
Protein, ur: NEGATIVE
RBC / HPF: NONE SEEN /HPF (ref 0–2)
Specific Gravity, Urine: 1.016 (ref 1.001–1.035)
Squamous Epithelial / HPF: NONE SEEN /HPF (ref <=5)
pH: 6.5 (ref 5.0–8.0)

## 2020-11-29 LAB — URINE CULTURE
MICRO NUMBER:: 12587054
Result:: NO GROWTH
SPECIMEN QUALITY:: ADEQUATE

## 2020-11-29 LAB — CULTURE INDICATED

## 2021-01-03 ENCOUNTER — Ambulatory Visit: Payer: BC Managed Care – PPO

## 2021-02-07 ENCOUNTER — Ambulatory Visit: Payer: BC Managed Care – PPO | Attending: Internal Medicine

## 2021-02-07 ENCOUNTER — Other Ambulatory Visit: Payer: Self-pay

## 2021-02-07 DIAGNOSIS — Z23 Encounter for immunization: Secondary | ICD-10-CM

## 2021-02-07 MED ORDER — PFIZER COVID-19 VAC BIVALENT 30 MCG/0.3ML IM SUSP
INTRAMUSCULAR | 0 refills | Status: DC
  Filled 2021-02-07: qty 0.3, 1d supply, fill #0

## 2021-02-07 NOTE — Progress Notes (Signed)
° °  Covid-19 Vaccination Clinic  Name:  Kayla Lewis    MRN: [id number] DOB: December 05, 1960  02/07/2021  [name] was observed post Covid-19 immunization for 15 minutes without incident. She was provided with Vaccine Information Sheet and instruction to access the V-Safe system.   [name] was instructed to call 911 with any severe reactions post vaccine: Difficulty breathing  Swelling of face and throat  A fast heartbeat  A bad rash all over body  Dizziness and weakness   Immunizations Administered     Name Date Dose VIS Date Route   Pfizer Covid-19 Vaccine Bivalent Booster 02/07/2021 10:46 AM 0.3 mL 09/25/2020 Intramuscular   Manufacturer: [place]   Lot: [id number]   [place]: [phone]

## 2021-06-18 ENCOUNTER — Other Ambulatory Visit: Payer: Self-pay | Admitting: Family Medicine

## 2021-06-18 DIAGNOSIS — Z1231 Encounter for screening mammogram for malignant neoplasm of breast: Secondary | ICD-10-CM

## 2021-07-04 ENCOUNTER — Ambulatory Visit
Admission: RE | Admit: 2021-07-04 | Discharge: 2021-07-04 | Disposition: A | Discharge status: Home / Self Care | Payer: BC Managed Care – PPO | Source: Ambulatory Visit

## 2021-07-04 DIAGNOSIS — L820 Inflamed seborrheic keratosis: Secondary | ICD-10-CM | POA: Diagnosis not present

## 2021-07-04 DIAGNOSIS — Z1231 Encounter for screening mammogram for malignant neoplasm of breast: Secondary | ICD-10-CM

## 2021-07-04 DIAGNOSIS — L814 Other melanin hyperpigmentation: Secondary | ICD-10-CM | POA: Diagnosis not present

## 2021-07-04 DIAGNOSIS — L821 Other seborrheic keratosis: Secondary | ICD-10-CM | POA: Diagnosis not present

## 2021-07-04 DIAGNOSIS — D2372 Other benign neoplasm of skin of left lower limb, including hip: Secondary | ICD-10-CM | POA: Diagnosis not present

## 2021-07-04 DIAGNOSIS — L738 Other specified follicular disorders: Secondary | ICD-10-CM | POA: Diagnosis not present

## 2021-10-27 IMAGING — MG MM DIGITAL SCREENING BILAT W/ TOMO AND CAD
6 of 10 series · 6 of 30 positions shown · non-contrast
Comparison: Previous exam(s).

CLINICAL DATA: Screening.

EXAM:
DIGITAL SCREENING BILATERAL MAMMOGRAM WITH TOMOSYNTHESIS AND CAD
TECHNIQUE: Bilateral screening digital craniocaudal and mediolateral oblique
mammograms were obtained. Bilateral screening digital breast
tomosynthesis was performed. The images were evaluated with
computer-aided detection.

[R CC synth-2D]
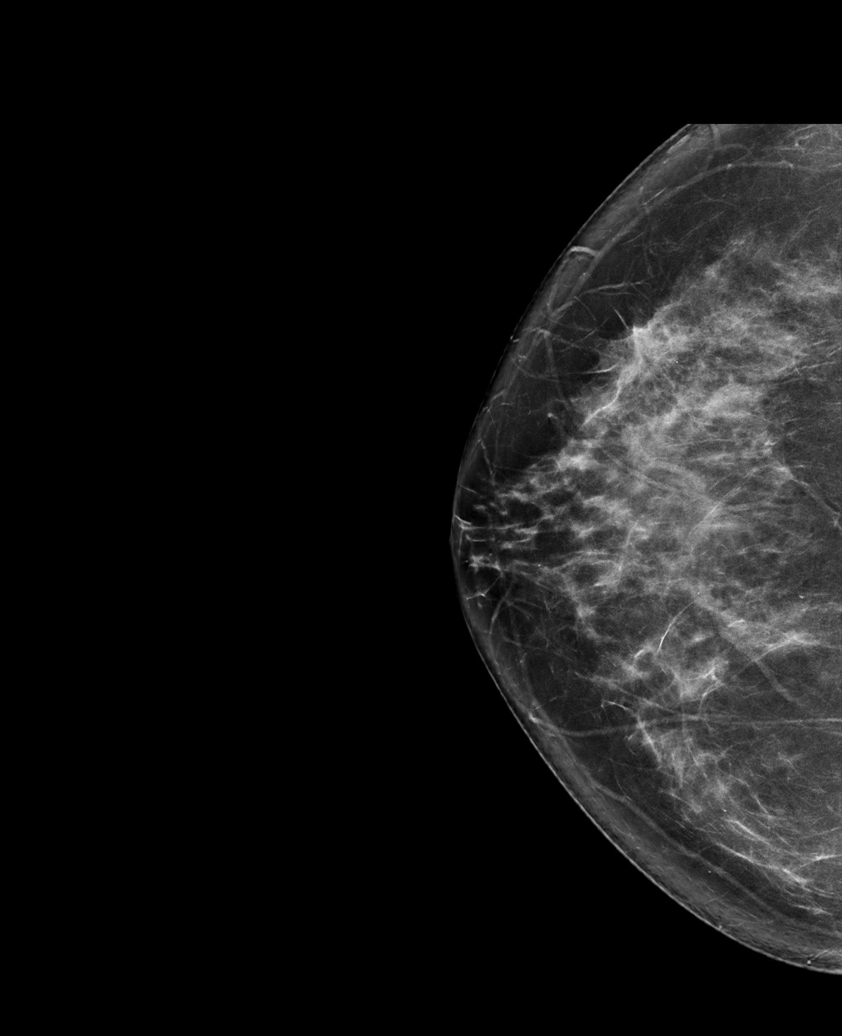

[R MLO synth-2D (1 of 2)]
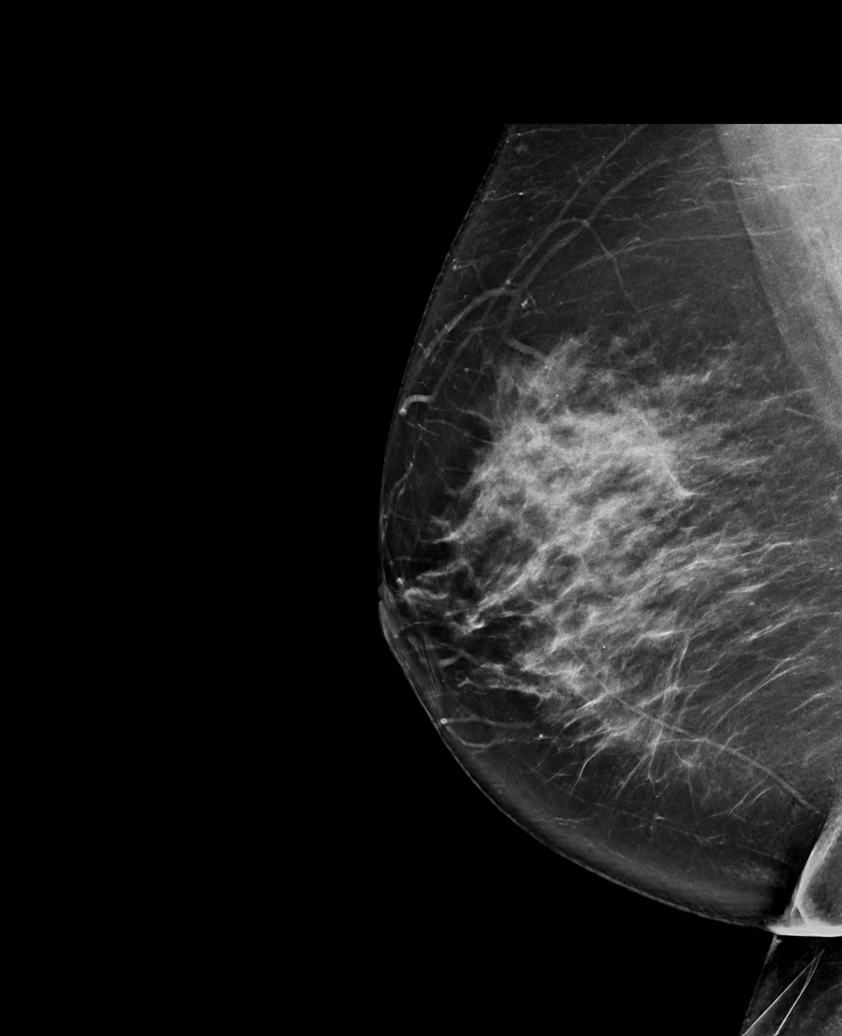

[R MLO synth-2D (2 of 2)]
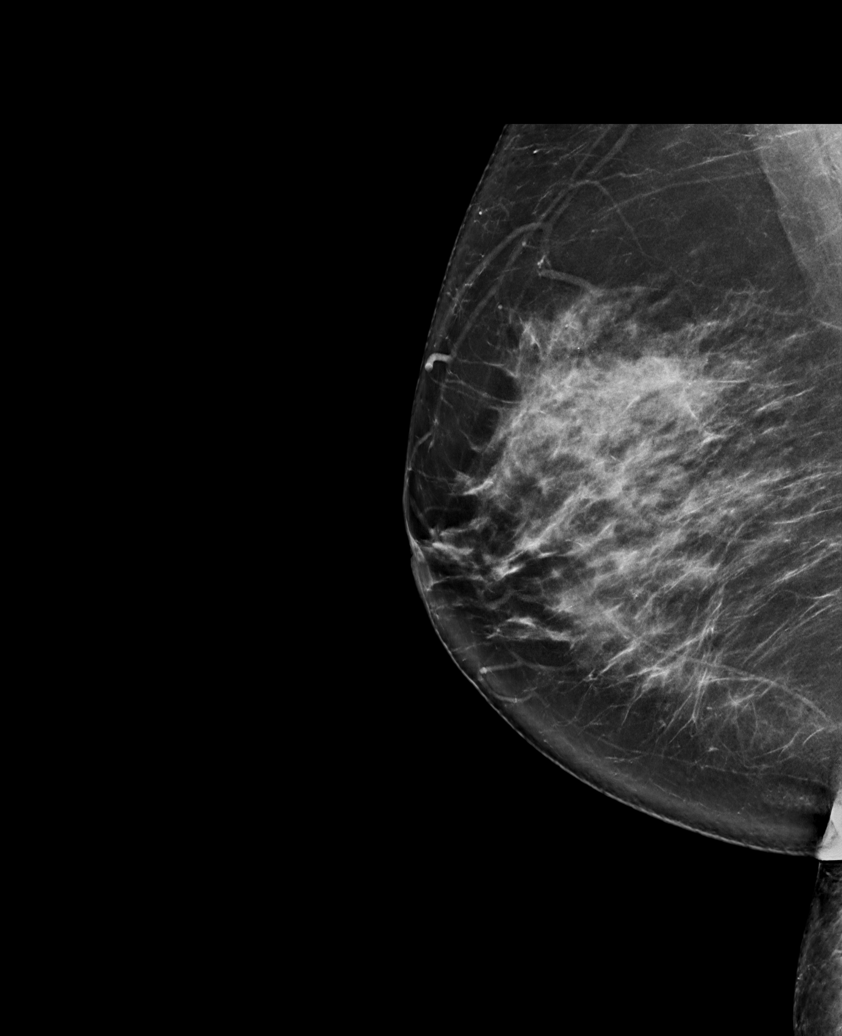

[L CC synth-2D]
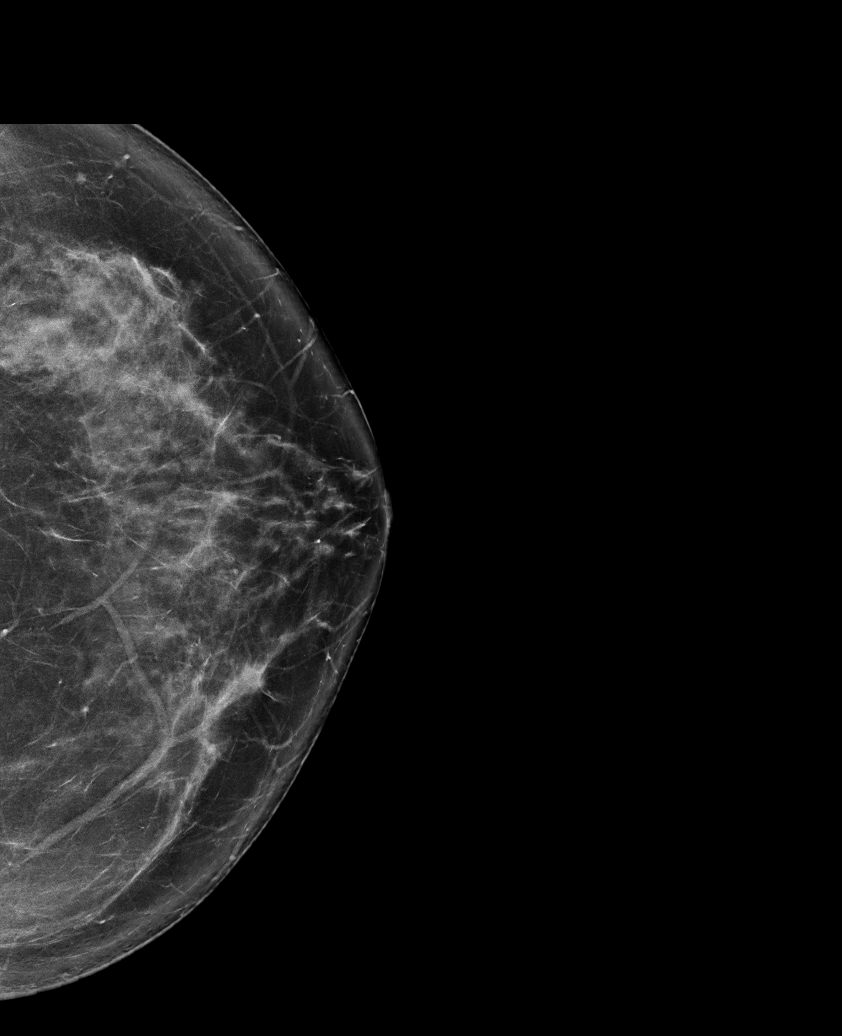

[L MLO synth-2D]
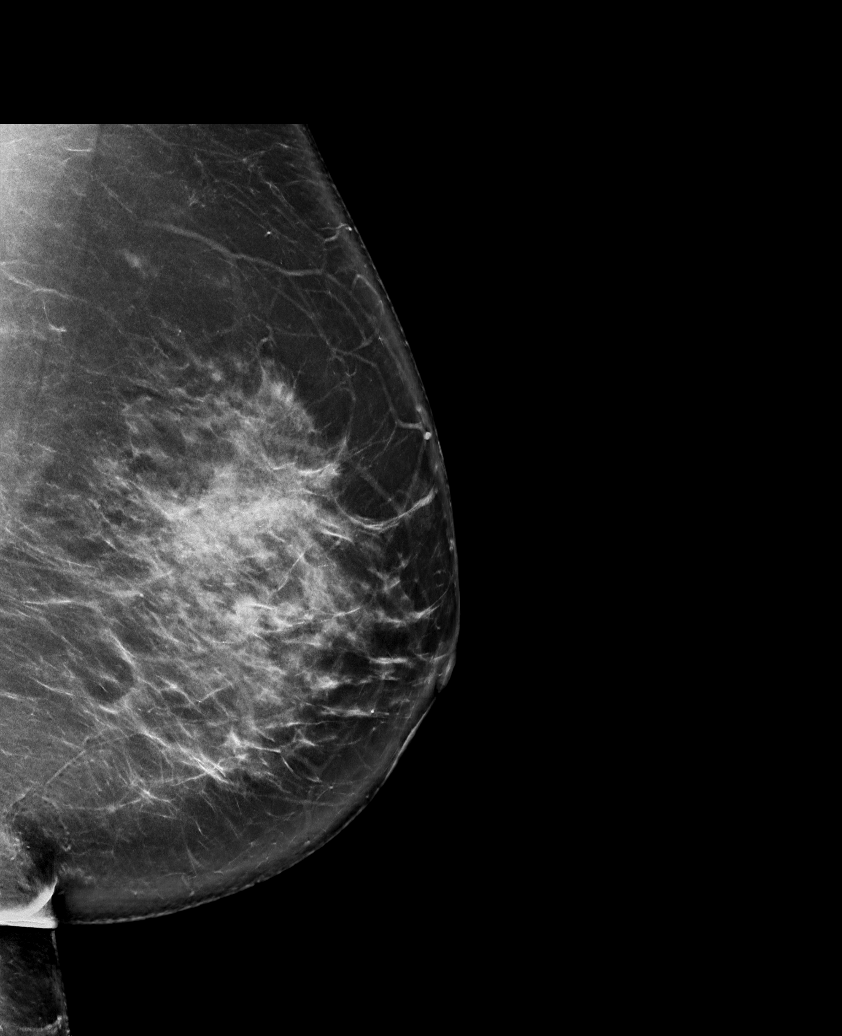

[L MLO tomo · tomo slice 47/93.0]
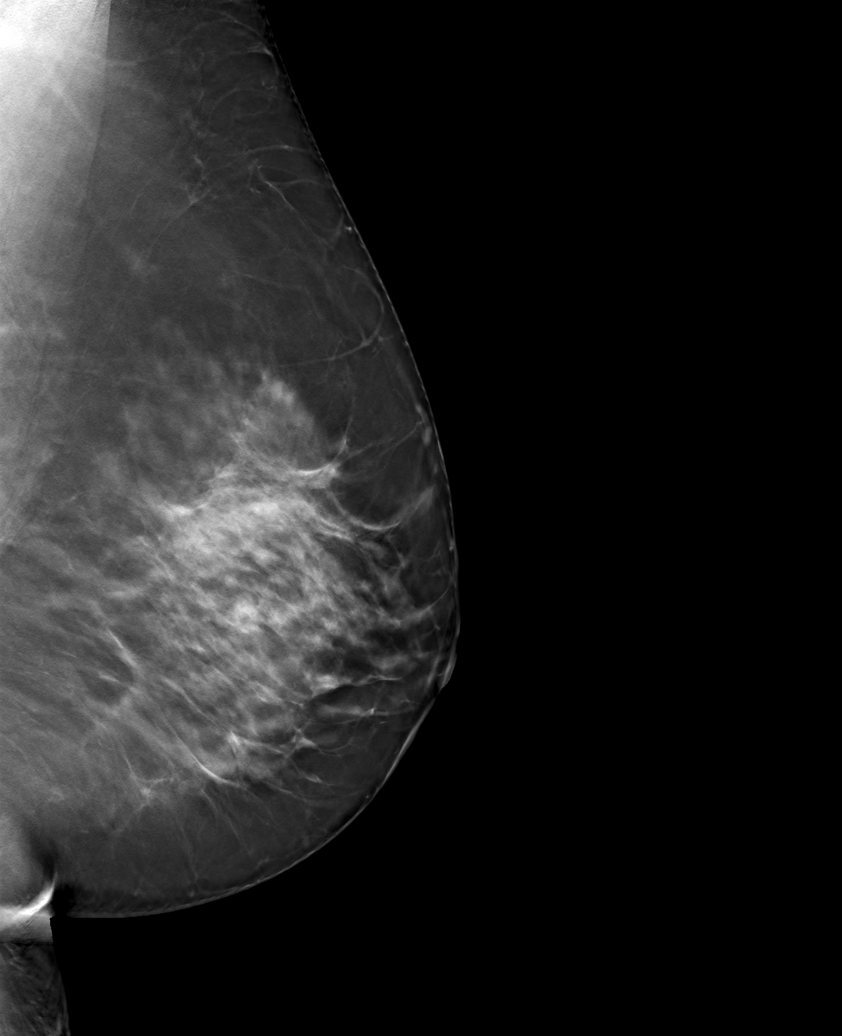

[6 of 30 positions shown; findings below may reference images not displayed]

ACR Breast Density Category c: The breast tissue is heterogeneously
dense, which may obscure small masses.
FINDINGS: In the left breast, a possible asymmetry warrants further
evaluation. In the right breast, no findings suspicious for
malignancy.
IMPRESSION: Further evaluation is suggested for possible asymmetry in the left
breast.

RECOMMENDATION:
Diagnostic mammogram and possibly ultrasound of the left breast.
(Code:[id number])

The patient will be contacted regarding the findings, and additional
imaging will be scheduled.

BI-RADS CATEGORY  0: Incomplete. Need additional imaging evaluation
and/or prior mammograms for comparison.

## 2022-05-20 ENCOUNTER — Ambulatory Visit (INDEPENDENT_AMBULATORY_CARE_PROVIDER_SITE_OTHER): Payer: BC Managed Care – PPO | Admitting: Medical

## 2022-05-20 ENCOUNTER — Encounter: Payer: Self-pay | Admitting: Medical

## 2022-05-20 VITALS — BP 138/100 | HR 105 | Wt 232.6 lb

## 2022-05-20 DIAGNOSIS — R030 Elevated blood-pressure reading, without diagnosis of hypertension: Secondary | ICD-10-CM | POA: Diagnosis not present

## 2022-05-20 DIAGNOSIS — R300 Dysuria: Secondary | ICD-10-CM

## 2022-05-20 DIAGNOSIS — R350 Frequency of micturition: Secondary | ICD-10-CM

## 2022-05-20 MED ORDER — NITROFURANTOIN MONOHYD MACRO 100 MG PO CAPS: 100.0000 mg | ORAL_CAPSULE | Freq: Two times a day (BID) | ORAL | 0 refills | Status: DC

## 2022-05-20 NOTE — Progress Notes (Signed)
Subjective:  Kayla Lewis is a 62 y.o. female who presents for Chief Complaint  Patient presents with   Acute Visit    Frequent urination, needle stick sensation in urethra occasionally for about 2 weeks, lower back pain x 1     Here for possible UTI.  She notes 2 week hx/o tenderness in urethra.   Having more frequent urination, not so much pain but last night back pain, couldn't get comfortable.   No abdominal pain.  No fever, no body aches, no chills.   No blood in urine.  Urine does look a little cloudy.    Last UTI long time ago.  Uses cranberry tablet daily.  No vaginal discharge, no concern for vaginal infection.   No concern for STD  No recent change in hygiene products.   Drinks coffee in the morning but no increase caffeine in general.    Has trip out of the country in early May. Wants to make sure this clears up.   Sees gyn in July for routine yearly  She does note hx/o kidney stone.   No other aggravating or relieving factors.    No other c/o.  Past Medical History:  Diagnosis Date   Bursitis    right hip   Erosive gastritis    Hiatal hernia    History of abnormal Pap smear 1991   Migraine age 55's   with aura   Obesity (BMI 30-39.9) 11/05/2016   Osteopenia determined by x-ray 10/25/2014   Plantar fasciitis    both feet   Prediabetes    Shingles outbreak 06/2008   abdomen   Current Outpatient Medications on File Prior to Visit  Medication Sig Dispense Refill   Calcium Carbonate-Vit D-Min (CALTRATE MINIS PLUS MINERALS) 300-800 MG-UNIT TABS Take by mouth.     cetirizine (ZYRTEC) 10 MG tablet Take 10 mg by mouth 2 (two) times daily.      Cholecalciferol (VITAMIN D3) 1000 units CAPS Take by mouth.      CRANBERRY PO Take by mouth.     Cyanocobalamin 1000 MCG TBCR Take by mouth.     Glucosamine-Chondroitin (OSTEO BI-FLEX REGULAR STRENGTH PO) Take by mouth 2 (two) times daily.     Magnesium 250 MG TABS Take by mouth.     Multiple Vitamins-Minerals (WOMENS ONE  DAILY PO) Take by mouth.     COVID-19 mRNA bivalent vaccine, Pfizer, (PFIZER COVID-19 VAC BIVALENT) injection Inject into the muscle. 0.3 mL 0   No current facility-administered medications on file prior to visit.     The following portions of the patient's history were reviewed and updated as appropriate: allergies, current medications, past family history, past medical history, past social history, past surgical history and problem list.  ROS Otherwise as in subjective above    Objective: BP (!) 138/100   Pulse (!) 105   Wt 232 lb 9.6 oz (105.5 kg)   LMP 05/27/2007   SpO2 96%   BMI 39.93 kg/m   BP Readings from Last 3 Encounters:  05/20/22 (!) 138/100  11/27/20 138/80  11/20/19 122/78    General appearance: alert, no distress, well developed, well nourished Heart: RRR, normal S1, S2, no murmurs Lungs: CTA bilaterally, no wheezes, rhonchi, or rales Abdomen: +bs, soft, non tender, non distended, no masses, no hepatomegaly, no splenomegaly Back: nontender Pulses: 2+ radial pulses, 2+ pedal pulses, normal cap refill Ext: no edema    Assessment: Encounter Diagnoses  Name Primary?   Dysuria Yes   Urinary frequency  Elevated blood pressure reading in office without diagnosis of hypertension      Plan: Symptoms are somewhat suggestive of a UTI.  Urine culture sent.  We discussed that she can either continue to hydrate well and give it more time and await culture or if symptoms are concerning enough she can go ahead and begin antibiotic as below.  We discussed that symptoms could be suggestive of UTI but cannot rule out other things such as renal stone or other.  Clinically she does not look ill.  Await culture  Blood pressure was elevated on triage today.  She normally has good blood pressure readings.  Advised she monitor BP a few days at home then call BP readings in this week.  Kayla Lewis was seen today for acute visit.  Diagnoses and all orders for this  visit:  Dysuria -     Urine Culture  Urinary frequency -     Urine Culture  Elevated blood pressure reading in office without diagnosis of hypertension  Other orders -     nitrofurantoin, macrocrystal-monohydrate, (MACROBID) 100 MG capsule; Take 1 capsule (100 mg total) by mouth 2 (two) times daily.    Follow up: pending culture

## 2022-05-22 LAB — URINE CULTURE

## 2022-05-22 NOTE — Progress Notes (Signed)
Urine culture does not show much bacteria.  For your current symptoms, did you end up starting the antibiotic?

## 2022-07-28 ENCOUNTER — Ambulatory Visit: Payer: BC Managed Care – PPO | Admitting: Nurse Practitioner

## 2022-11-02 ENCOUNTER — Other Ambulatory Visit: Payer: Self-pay | Admitting: Nurse Practitioner

## 2022-11-02 DIAGNOSIS — Z1231 Encounter for screening mammogram for malignant neoplasm of breast: Secondary | ICD-10-CM

## 2022-11-30 ENCOUNTER — Ambulatory Visit: Payer: BC Managed Care – PPO | Admitting: Family Medicine

## 2022-11-30 ENCOUNTER — Encounter: Payer: Self-pay | Admitting: Family Medicine

## 2022-11-30 VITALS — BP 122/82 | HR 84 | Temp 99.2°F | Ht 64.0 in | Wt 238.2 lb

## 2022-11-30 DIAGNOSIS — Z23 Encounter for immunization: Secondary | ICD-10-CM | POA: Diagnosis not present

## 2022-11-30 DIAGNOSIS — K625 Hemorrhage of anus and rectum: Secondary | ICD-10-CM | POA: Diagnosis not present

## 2022-11-30 DIAGNOSIS — K644 Residual hemorrhoidal skin tags: Secondary | ICD-10-CM

## 2022-11-30 NOTE — Progress Notes (Signed)
Chief Complaint  Patient presents with   Blood In Stools    Friday had 3 bowel movements and Sat 4 movements all had bright red blood. Only a couple had some mucous when she wiped. Sunday maybe the first or second one. Today she has had normal bowel movements, just more often. Started fiber supplements (Benefiber) Saturday. And some prunes yesterday and today. Increased water intake.    Normally she has BM's 2-3x/day, usually after meals. She avoids dairy--tends to get constipated when she eats dairy.  She realizes that she has had some recent stressors--she had well issues over the last week, didn't have water.  Needed the top of the well replaced, and water heater.  Denies any issue with water quality. Stressful job x 4 mos.  She was doing fine until 11/27/22 Friday morning--had harder stools, BRB on the toilet paper, some blood in the toilet water, and some bloody mucus noted in the stool.  She had some straining, but wasn't painful to pass the stool. She feels like there is some chafing/irritation externally.   She has increased gas, and going to the bathroom more frequently since 11/1.  She started Benefiber on Saturday 11/2, due to harder stool. Sunday--the blood wasn't as bright red, and the bleeding eased off, had more normal bowel movements later in the day. Stool was back to normal today--no blood, not firm.  She has been drinking more water.  Had slight bloating of the stomach. Had some cramping only early on. No abdominal pain since then. She started a probiotic about 2 weeks ago  Last colonoscopy 09/2020 with Dr. Loreta Ave.  1 polyp, hyperplastic. No mention of hemorrhoids, diverticulosis   PMH, PSH, SH reviewed  Outpatient Encounter Medications as of 11/30/2022  Medication Sig Note   cetirizine (ZYRTEC) 10 MG tablet Take 10 mg by mouth 2 (two) times daily.     Cholecalciferol (VITAMIN D3) 1000 units CAPS Take by mouth.     CRANBERRY PO Take by mouth.    Cyanocobalamin 1000  MCG TBCR Take by mouth.    Glucosamine-Chondroitin (OSTEO BI-FLEX REGULAR STRENGTH PO) Take by mouth 2 (two) times daily.    Magnesium 250 MG TABS Take by mouth.    Multiple Vitamins-Minerals (WOMENS ONE DAILY PO) Take by mouth.    Probiotic Product (PROBIOTIC DAILY PO) Take 1 capsule by mouth daily. 11/30/2022: Nature's Bounty   Wheat Dextrin (BENEFIBER PO) Take 10 mLs by mouth as needed. 11/30/2022: 2 tsp-Sat and Wynelle Link and 1tsp today   [DISCONTINUED] Calcium Carbonate-Vit D-Min (CALTRATE MINIS PLUS MINERALS) 300-800 MG-UNIT TABS Take by mouth.    [DISCONTINUED] COVID-19 mRNA bivalent vaccine, Pfizer, (PFIZER COVID-19 VAC BIVALENT) injection Inject into the muscle.    [DISCONTINUED] nitrofurantoin, macrocrystal-monohydrate, (MACROBID) 100 MG capsule Take 1 capsule (100 mg total) by mouth 2 (two) times daily.    No facility-administered encounter medications on file as of 11/30/2022.   Allergies  Allergen Reactions   Codeine    Esomeprazole Magnesium     nexium   Ibuprofen    Nsaids    Penicillins    Tramadol Rash    ROS: No f/c. She tends to run temps in 99 range chronically. No n/v/d.  Constipation and BRBPR per HPI. No urinary complaints. No vaginal complaints. No URI symptoms, chest pain, shortness of breath.    PHYSICAL EXAM:  BP 122/82   Pulse 84   Temp 99.2 F (37.3 C) (Tympanic)   Ht 5\' 4"  (1.626 m)   Wt 238 lb  3.2 oz (108 kg)   LMP 05/27/2007   BMI 40.89 kg/m   Well-appearing, pleasant female, in no distress HEENT: conjunctiva and sclera are clear, EOMI Neck: no lymphadenopathy or mass Heart: regular rate and rhythm Lungs: clear bilaterally Abdomen: soft, nontender, obese.  Normal bowel sounds. Rectal exam: Tag with one area of irritation/erythema Purple/vascular area posteriorly, not bulging or bleeding, nontender. No fissure or area that seems like a recently healed fissure. Anoscopy: normal mucosa. No bleeding noted. No internal hemorrhoids. Digital rectal  exam: No masses. Heme negative smear of stool  Photos on phone reviewed (from Saturday, until this morning's normal BM)--normal, formed stool with some BRB on the surface, and some droplets of blood in the toilet water. BRB on toilet paper (once also seen with light brown stool and blood on TP). Normal stool toay   ASSESSMENT/PLAN:  BRBPR (bright red blood per rectum) - suspect related to external hemorroid (cannot r/o healed fissure). Reassured no e/o significant GI bleed. f/u if recurs  External hemorrhoid - cont fiber supplement/high fiber diet, increased water intake. Discussed OTC creams to use prn  Need for influenza vaccination - Plan: Flu vaccine trivalent PF, 6mos and older(Flulaval,Afluria,Fluarix,Fluzone)  Need for COVID-19 vaccine - Plan: Pfizer Comirnaty Covid -19 Vaccine 59yrs and older   Rectal bleeding--suspect related to external hemorrhoid vs fissure. Continue high fiber diet, plenty of water Can use topical hemorrhoidal creams as needed

## 2022-11-30 NOTE — Patient Instructions (Signed)
  Continue to drink plenty of water. Continue high fiber diet (fruits/vegetables and/or fiber supplement). You can use topical hemorrhoid creams as needed for any irritation or recurrent bleeding (Anusol HC cream). There was no internal hemorrhoids or evidence of bleeding from further up in the colon. It seems to be all external.

## 2022-12-03 ENCOUNTER — Encounter: Payer: Self-pay | Admitting: Family Medicine

## 2022-12-03 MED ORDER — SUMATRIPTAN SUCCINATE 100 MG PO TABS: ORAL_TABLET | ORAL | 0 refills | Status: DC

## 2023-01-18 ENCOUNTER — Ambulatory Visit: Payer: BC Managed Care – PPO | Admitting: Radiology

## 2023-01-18 ENCOUNTER — Ambulatory Visit
Admission: RE | Admit: 2023-01-18 | Discharge: 2023-01-18 | Disposition: A | Discharge status: Home / Self Care | Payer: BC Managed Care – PPO | Source: Ambulatory Visit | Attending: Nurse Practitioner | Admitting: Nurse Practitioner

## 2023-01-18 DIAGNOSIS — Z1231 Encounter for screening mammogram for malignant neoplasm of breast: Secondary | ICD-10-CM

## 2023-01-21 ENCOUNTER — Other Ambulatory Visit (HOSPITAL_COMMUNITY)
Admission: RE | Admit: 2023-01-21 | Discharge: 2023-01-21 | Disposition: A | Discharge status: Home / Self Care | Payer: BC Managed Care – PPO | Source: Ambulatory Visit | Attending: Radiology | Admitting: Radiology

## 2023-01-21 ENCOUNTER — Encounter: Payer: Self-pay | Admitting: Radiology

## 2023-01-21 ENCOUNTER — Ambulatory Visit (INDEPENDENT_AMBULATORY_CARE_PROVIDER_SITE_OTHER): Payer: BC Managed Care – PPO | Admitting: Radiology

## 2023-01-21 VITALS — BP 134/86 | HR 90 | Ht 64.0 in | Wt 234.0 lb

## 2023-01-21 DIAGNOSIS — Z01419 Encounter for gynecological examination (general) (routine) without abnormal findings: Secondary | ICD-10-CM | POA: Diagnosis present

## 2023-01-21 DIAGNOSIS — Z8739 Personal history of other diseases of the musculoskeletal system and connective tissue: Secondary | ICD-10-CM | POA: Diagnosis not present

## 2023-01-21 NOTE — Progress Notes (Signed)
   Kayla Lewis 27-May-1960 [id number]   History: Postmenopausal 62 y.o. presents for annual exam. Hx of osteoporosis, had previously beed on prolia but it was stopped after her DEXA improved to osteopenia in 2021. No DEXA since. Very light exercise. Has her PCP physical tomorrow. No gyn concerns.    Gynecologic History Postmenopausal Last Pap: 2019. Results were: normal Last mammogram: 01/18/23. Results were: normal Last colonoscopy: 09/2020 DEXA:2021 osteopenia   Obstetric History OB History  Gravida Para Term Preterm AB Living  2 2 2  0 0 2  SAB IAB Ectopic Multiple Live Births  0 0 0 0 2    # Outcome Date GA Lbr Len/2nd Weight Sex Type Anes PTL Lv  2 Term 1988    M Vag-Spont   LIV  1 Term 96    F Vag-Spont   LIV     The following portions of the patient's history were reviewed and updated as appropriate: allergies, current medications, past family history, past medical history, past social history, past surgical history, and problem list.  Review of Systems Pertinent items noted in HPI and remainder of comprehensive ROS otherwise negative.  Past medical history, past surgical history, family history and social history were all reviewed and documented in the EPIC chart.  Exam:  Vitals:   01/21/23 0845  BP: 134/86  Pulse: 90  SpO2: 96%  Weight: 234 lb (106.1 kg)  Height: 5\' 4"  (1.626 m)   Body mass index is 40.17 kg/m.  General appearance:  Normal Thyroid:  Symmetrical, normal in size, without palpable masses or nodularity. Respiratory  Auscultation:  Clear without wheezing or rhonchi Cardiovascular  Auscultation:  Regular rate, without rubs, murmurs or gallops  Edema/varicosities:  Not grossly evident Abdominal  Soft,nontender, without masses, guarding or rebound.  Liver/spleen:  No organomegaly noted  Hernia:  None appreciated  Skin  Inspection:  Grossly normal Breasts: Examined lying and sitting.   Right: Without masses, retractions, nipple discharge  or axillary adenopathy.   Left: Without masses, retractions, nipple discharge or axillary adenopathy. Genitourinary   Inguinal/mons:  Normal without inguinal adenopathy  External genitalia:  Normal appearing vulva with no masses, tenderness, or lesions  BUS/Urethra/Skene's glands:  Normal  Vagina:  Normal appearing with normal color and discharge, no lesions. Atrophy: mild   Cervix:  Normal appearing without discharge or lesions  Uterus:  Normal in size, shape and contour.  Midline and mobile, nontender  Adnexa/parametria:     Rt: Normal in size, without masses or tenderness.   Lt: Normal in size, without masses or tenderness.  Anus and perineum: Normal    Kayla Lewis, CMA present for exam  Assessment/Plan:   1. Well woman exam with routine gynecological exam (Primary) - Cytology - PAP( [facility])  2. History of osteoporosis - DG Bone Density; Future    Discussed SBE, colonoscopy and DEXA screening as directed. Recommend of exercise weekly, including weight bearing exercise. Return in 1 year for annual or sooner prn.  Kayla Lewis B WHNP-BC, 9:02 AM 01/21/2023

## 2023-01-21 NOTE — Patient Instructions (Signed)
Preventive Care 40-64 Years Old, Female Preventive care refers to lifestyle choices and visits with your health care provider that can promote health and wellness. Preventive care visits are also called wellness exams. What can I expect for my preventive care visit? Counseling Your health care provider may ask you questions about your: Medical history, including: Past medical problems. Family medical history. Pregnancy history. Current health, including: Menstrual cycle. Method of birth control. Emotional well-being. Home life and relationship well-being. Sexual activity and sexual health. Lifestyle, including: Alcohol, nicotine or tobacco, and drug use. Access to firearms. Diet, exercise, and sleep habits. Work and work environment. Sunscreen use. Safety issues such as seatbelt and bike helmet use. Physical exam Your health care provider will check your: Height and weight. These may be used to calculate your BMI (body mass index). BMI is a measurement that tells if you are at a healthy weight. Waist circumference. This measures the distance around your waistline. This measurement also tells if you are at a healthy weight and may help predict your risk of certain diseases, such as type 2 diabetes and high blood pressure. Heart rate and blood pressure. Body temperature. Skin for abnormal spots. What immunizations do I need?  Vaccines are usually given at various ages, according to a schedule. Your health care provider will recommend vaccines for you based on your age, medical history, and lifestyle or other factors, such as travel or where you work. What tests do I need? Screening Your health care provider may recommend screening tests for certain conditions. This may include: Lipid and cholesterol levels. Diabetes screening. This is done by checking your blood sugar (glucose) after you have not eaten for a while (fasting). Pelvic exam and Pap test. Hepatitis B test. Hepatitis C  test. HIV (human immunodeficiency virus) test. STI (sexually transmitted infection) testing, if you are at risk. Lung cancer screening. Colorectal cancer screening. Mammogram. Talk with your health care provider about when you should start having regular mammograms. This may depend on whether you have a family history of breast cancer. BRCA-related cancer screening. This may be done if you have a family history of breast, ovarian, tubal, or peritoneal cancers. Bone density scan. This is done to screen for osteoporosis. Talk with your health care provider about your test results, treatment options, and if necessary, the need for more tests. Follow these instructions at home: Eating and drinking  Eat a diet that includes fresh fruits and vegetables, whole grains, lean protein, and low-fat dairy products. Take vitamin and mineral supplements as recommended by your health care provider. Do not drink alcohol if: Your health care provider tells you not to drink. You are pregnant, may be pregnant, or are planning to become pregnant. If you drink alcohol: Limit how much you have to 0-1 drink a day. Know how much alcohol is in your drink. In the [place] one drink equals one 12 oz bottle of beer (355 mL), one 5 oz glass of wine (148 mL), or one 1 oz glass of hard liquor (44 mL). Lifestyle Brush your teeth every morning and night with fluoride toothpaste. Floss one time each day. Exercise for at least 30 minutes 5 or more days each week. Do not use any products that contain nicotine or tobacco. These products include cigarettes, chewing tobacco, and vaping devices, such as e-cigarettes. If you need help quitting, ask your health care provider. Do not use drugs. If you are sexually active, practice safe sex. Use a condom or other form of protection to   prevent STIs. If you do not wish to become pregnant, use a form of birth control. If you plan to become pregnant, see your health care provider for a  prepregnancy visit. Take aspirin only as told by your health care provider. Make sure that you understand how much to take and what form to take. Work with your health care provider to find out whether it is safe and beneficial for you to take aspirin daily. Find healthy ways to manage stress, such as: Meditation, yoga, or listening to music. Journaling. Talking to a trusted person. Spending time with friends and family. Minimize exposure to UV radiation to reduce your risk of skin cancer. Safety Always wear your seat belt while driving or riding in a vehicle. Do not drive: If you have been drinking alcohol. Do not ride with someone who has been drinking. When you are tired or distracted. While texting. If you have been using any mind-altering substances or drugs. Wear a helmet and other protective equipment during sports activities. If you have firearms in your house, make sure you follow all gun safety procedures. Seek help if you have been physically or sexually abused. What's next? Visit your health care provider once a year for an annual wellness visit. Ask your health care provider how often you should have your eyes and teeth checked. Stay up to date on all vaccines. This information is not intended to replace advice given to you by your health care provider. Make sure you discuss any questions you have with your health care provider. Document Revised: 07/10/2020 Document Reviewed: 07/10/2020 Elsevier Patient Education  2024 [employer]  

## 2023-01-22 ENCOUNTER — Encounter: Payer: Self-pay | Admitting: Nurse Practitioner

## 2023-01-22 ENCOUNTER — Ambulatory Visit (INDEPENDENT_AMBULATORY_CARE_PROVIDER_SITE_OTHER): Payer: BC Managed Care – PPO | Admitting: Nurse Practitioner

## 2023-01-22 VITALS — BP 126/84 | HR 75 | Ht 64.5 in | Wt 233.0 lb

## 2023-01-22 DIAGNOSIS — J0140 Acute pansinusitis, unspecified: Secondary | ICD-10-CM | POA: Insufficient documentation

## 2023-01-22 DIAGNOSIS — E559 Vitamin D deficiency, unspecified: Secondary | ICD-10-CM

## 2023-01-22 DIAGNOSIS — E538 Deficiency of other specified B group vitamins: Secondary | ICD-10-CM

## 2023-01-22 DIAGNOSIS — G43909 Migraine, unspecified, not intractable, without status migrainosus: Secondary | ICD-10-CM | POA: Diagnosis not present

## 2023-01-22 DIAGNOSIS — M5431 Sciatica, right side: Secondary | ICD-10-CM

## 2023-01-22 DIAGNOSIS — E785 Hyperlipidemia, unspecified: Secondary | ICD-10-CM

## 2023-01-22 DIAGNOSIS — Z0000 Encounter for general adult medical examination without abnormal findings: Secondary | ICD-10-CM | POA: Diagnosis not present

## 2023-01-22 DIAGNOSIS — K2940 Chronic atrophic gastritis without bleeding: Secondary | ICD-10-CM

## 2023-01-22 HISTORY — DX: Acute pansinusitis, unspecified: J01.40

## 2023-01-22 HISTORY — DX: Sciatica, right side: M54.31

## 2023-01-22 LAB — CYTOLOGY - PAP
Comment: NEGATIVE
Diagnosis: NEGATIVE
High risk HPV: NEGATIVE

## 2023-01-22 LAB — LIPID PANEL

## 2023-01-22 MED ORDER — SUMATRIPTAN SUCCINATE 100 MG PO TABS: ORAL_TABLET | ORAL | 6 refills | Status: DC

## 2023-01-22 MED ORDER — AZITHROMYCIN 250 MG PO TABS: ORAL_TABLET | ORAL | 0 refills | Status: AC

## 2023-01-22 NOTE — Patient Instructions (Signed)
TENS Unit, lidocaine patches, stretches, heat/ice   Sciatica  Sciatica is pain, numbness, weakness, or tingling along the path of the sciatic nerve. The sciatic nerve starts in the lower back and runs down the back of each leg. The nerve controls the muscles in the lower leg and in the back of the knee. It also provides feeling (sensation) to the back of the thigh, the lower leg, and the sole of the foot. Sciatica is a symptom of another medical condition that pinches or puts pressure on the sciatic nerve. Sciatica most often only affects one side of the body. Sciatica usually goes away on its own or with treatment. In some cases, sciatica may come back (recur). What are the causes? This condition is caused by pressure on the sciatic nerve or pinching of the nerve. This may be the result of: A disk in between the bones of the spine bulging out too far (herniated disk). Age-related changes in the spinal disks. A pain disorder that affects a muscle in the buttock. Extra bone growth near the sciatic nerve. A break (fracture) of the pelvis. Pregnancy. Tumor. This is rare. What increases the risk? The following factors may make you more likely to develop this condition: Playing sports that place pressure or stress on the spine. Having poor strength and flexibility. A history of back injury or surgery. Sitting for long periods of time. Doing activities that involve repetitive bending or lifting. Obesity. What are the signs or symptoms? Symptoms can vary from mild to very severe. They may include: Any of the following problems in the lower back, leg, hip, or buttock: Mild tingling, numbness, or dull aches. Burning sensations. Sharp pains. Numbness in the back of the calf or the sole of the foot. Leg weakness. Severe back pain that makes movement difficult. Symptoms may get worse when you cough, sneeze, or laugh, or when you sit or stand for long periods of time. How is this  diagnosed? This condition may be diagnosed based on: Your symptoms and medical history. A physical exam. Blood tests. Imaging tests, such as: X-rays. An MRI. A CT scan. How is this treated? In many cases, this condition improves on its own without treatment. However, treatment may include: Reducing or modifying physical activity. Exercising, including strengthening and stretching. Icing and applying heat to the affected area. Medicines that help to: Relieve pain and swelling. Relax your muscles. Injections of medicines that help to relieve pain and inflammation (steroids) around the sciatic nerve. Surgery. Follow these instructions at home: Medicines Take over-the-counter and prescription medicines only as told by your health care provider. Ask your health care provider if the medicine prescribed to you requires you to avoid driving or using heavy machinery. Managing pain     If directed, put ice on the affected area. To do this: Put ice in a plastic bag. Place a towel between your skin and the bag. Leave the ice on for 20 minutes, 2-3 times a day. If your skin turns bright red, remove the ice right away to prevent skin damage. The risk of skin damage is higher if you cannot feel pain, heat, or cold. If directed, apply heat to the affected area as often as told by your health care provider. Use the heat source that your health care provider recommends, such as a moist heat pack or a heating pad. Place a towel between your skin and the heat source. Leave the heat on for 20-30 minutes. If your skin turns bright red, remove  the heat right away to prevent burns. The risk of burns is higher if you cannot feel pain, heat, or cold. Activity  Return to your normal activities as told by your health care provider. Ask your health care provider what activities are safe for you. Avoid activities that make your symptoms worse. Take brief periods of rest throughout the day. When you rest  for longer periods, mix in some mild activity or stretching between periods of rest. This will help to prevent stiffness and pain. Avoid sitting for long periods of time without moving. Get up and move around at least one time each hour. Exercise and stretch regularly as told by your health care provider. Do not lift anything that is heavier than 10 lb (4.5 kg) until your health care provider says that it is safe. When you do not have symptoms, you should still avoid heavy lifting, especially repetitive heavy lifting. When you lift objects, always use proper lifting technique, which includes: Bending your knees. Keeping the load close to your body. Avoiding twisting. General instructions Maintain a healthy weight. Excess weight puts extra stress on your back. Wear supportive, comfortable shoes. Avoid wearing high heels. Avoid sleeping on a mattress that is too soft or too hard. A mattress that is firm enough to support your back when you sleep may help to reduce your pain. Contact a health care provider if: Your pain is not controlled by medicine. Your pain does not improve or gets worse. Your pain lasts longer than 4 weeks. You have unexplained weight loss. Get help right away if: You are not able to control when you urinate or have bowel movements (incontinence). You have: Weakness in your lower back, pelvis, buttocks, or legs that gets worse. Redness or swelling of your back. A burning sensation when you urinate. Summary Sciatica is pain, numbness, weakness, or tingling along the path of the sciatic nerve, which may include the lower back, legs, hips, and buttocks. This condition is caused by pressure on the sciatic nerve or pinching of the nerve. Treatment often includes rest, exercise, medicines, and applying ice or heat. This information is not intended to replace advice given to you by your health care provider. Make sure you discuss any questions you have with your health care  provider. Document Revised: 04/21/2021 Document Reviewed: 04/21/2021 Elsevier Patient Education  2024 [employer].

## 2023-01-22 NOTE — Progress Notes (Unsigned)
Shawna Clamp, DNP, AGNP-c [facility] [address] [place], [place] [address] Main Office [phone]  BP 126/84   Pulse 75   Ht 5' 4.5" (1.638 m)   Wt 233 lb (105.7 kg)   LMP 05/27/2007   SpO2 98%   BMI 39.38 kg/m    Subjective:    Patient ID: Kayla Lewis, female    DOB: 1961/01/11, 62 y.o.   MRN: [id number]  HPI: Kayla Lewis is a 62 y.o. female presenting on 01/22/2023 for comprehensive medical examination.     Pertinent items are noted in HPI.  IMMUNIZATIONS:   {Flu Vaccine:28474} {Prevnar 46:96295} {Prevnar 20:28476} {Pneumovax 23:28475} {Vac Shingrix:28478} {HPV:28479} {Tetanus:28480} {COVID:28481} RSV: {Yes/No:304960894}  HEALTH MAINTENANCE: Pap Smear {HM Status:28583} Mammogram {HM Status:28583} Colon Cancer Screening {HM Status:28583} Bone Density {HM Status:28583} STI Testing {HM Status:28583} Lung CT {HM Status:28583}  Concerns with vision, hearing, or dentition: {Yes/No:304960894}  Most Recent Depression Screen:     01/22/2023   10:28 AM 01/21/2023    8:42 AM 05/20/2022    3:24 PM 07/28/2017    8:28 AM 10/01/2016    4:04 PM  Depression screen PHQ 2/9  Decreased Interest 0 0 0 0 0  Down, Depressed, Hopeless 0 0 0 0 0  PHQ - 2 Score 0 0 0 0 0   Most Recent Anxiety Screen:      No data to display         Most Recent Fall Screen:    01/22/2023   10:28 AM 05/20/2022    3:24 PM 07/28/2017    8:28 AM 10/01/2016    4:04 PM  Fall Risk   Falls in the past year? 0 0 No No  Number falls in past yr: 0 0    Injury with Fall? 0 0    Risk for fall due to :  No Fall Risks    Follow up  Falls evaluation completed      Past medical history, surgical history, medications, allergies, family history and social history reviewed with patient today and changes made to appropriate areas of the chart.  Past Medical History:  Past Medical History:  Diagnosis Date  . Bursitis    right hip  . Erosive gastritis   . Hiatal hernia    . History of abnormal Pap smear 1991  . Migraine age 45's   with aura  . Obesity (BMI 30-39.9) 11/05/2016  . Osteopenia determined by x-ray 10/25/2014  . Plantar fasciitis    both feet  . Prediabetes   . Shingles outbreak 06/2008   abdomen   Medications:  Current Outpatient Medications on File Prior to Visit  Medication Sig  . Biotin 1 MG CAPS Biotin  . cetirizine (ZYRTEC) 10 MG tablet Take 10 mg by mouth 2 (two) times daily.   . Cholecalciferol (VITAMIN D3) 1000 units CAPS Take by mouth.   . CRANBERRY PO Take by mouth.  . Cyanocobalamin 1000 MCG TBCR Take by mouth.  . ELDERBERRY PO Take by mouth.  . Glucosamine-Chondroitin (OSTEO BI-FLEX REGULAR STRENGTH PO) Take by mouth 2 (two) times daily.  . Magnesium 250 MG TABS Take by mouth.  . Multiple Vitamins-Minerals (WOMENS ONE DAILY PO) Take by mouth.  . Probiotic Product (PROBIOTIC DAILY PO) Take 1 capsule by mouth daily.  . SUMAtriptan (IMITREX) 100 MG tablet Take 1 tablet by mouth at onset of migraine. May repeat once in 2 hours if needed  . Wheat Dextrin (BENEFIBER PO) Take 10 mLs by mouth as needed.  No current facility-administered medications on file prior to visit.   Surgical History:  Past Surgical History:  Procedure Laterality Date  . BREAST EXCISIONAL BIOPSY Left 1997   benign  . BREAST SURGERY Left 1997   lumpectomy benign  . CERVICAL POLYPECTOMY  1999  . CERVIX LESION DESTRUCTION    . COLONOSCOPY W/ BIOPSIES  09/22/10   1 polyp benign recheck in 10 years.  . TONSILLECTOMY  age 24   Allergies:  Allergies  Allergen Reactions  . Codeine   . Esomeprazole Magnesium     nexium  . Ibuprofen   . Nsaids   . Penicillins   . Tramadol Rash   Family History:  Family History  Problem Relation Age of Onset  . AAA (abdominal aortic aneurysm) Mother 24       heavy smoker   . Deep vein thrombosis Mother   . Diabetes Mother   . Heart disease Mother   . COPD Father   . Bladder Cancer Father   . Heart attack  Father   . Depression Sister   . Hypertension Sister   . Obesity Sister   . Hyperlipidemia Sister   . Heart disease Sister 78       MI  . Asthma Sister   . Obesity Sister   . Breast cancer Neg Hx        Objective:    BP 126/84   Pulse 75   Ht 5' 4.5" (1.638 m)   Wt 233 lb (105.7 kg)   LMP 05/27/2007   SpO2 98%   BMI 39.38 kg/m   Wt Readings from Last 3 Encounters:  01/22/23 233 lb (105.7 kg)  01/21/23 234 lb (106.1 kg)  11/30/22 238 lb 3.2 oz (108 kg)    Physical Exam {Physical Exam:31112}  Results for orders placed or performed in visit on 05/20/22  Urine Culture   Collection Time: 05/20/22  3:20 PM   Specimen: Urine   UR  Result Value Ref Range   Urine Culture, Routine Final report    Organism ID, Bacteria Comment        Assessment & Plan:   Problem List Items Addressed This Visit   None     Follow up plan: No follow-ups on file.  NEXT PREVENTATIVE PHYSICAL DUE IN 1 YEAR.  PATIENT COUNSELING PROVIDED FOR ALL ADULT PATIENTS: A well balanced diet low in saturated fats, cholesterol, and moderation in carbohydrates.  This can be as simple as monitoring portion sizes and cutting back on sugary beverages such as soda and juice to start with.    Daily water consumption of at least 64 ounces.  Physical activity at least 180 minutes per week.  If just starting out, start 10 minutes a day and work your way up.   This can be as simple as taking the stairs instead of the elevator and walking 2-3 laps around the office  purposefully every day.   STD protection, partner selection, and regular testing if high risk.  Limited consumption of alcoholic beverages if alcohol is consumed. For men, I recommend no more than 14 alcoholic beverages per week, spread out throughout the week (max 2 per day). Avoid "binge" drinking or consuming large quantities of alcohol in one setting.  Please let me know if you feel you may need help with reduction or quitting alcohol  consumption.   Avoidance of nicotine, if used. Please let me know if you feel you may need help with reduction or quitting nicotine use.  Daily mental health attention. This can be in the form of 5 minute daily meditation, prayer, journaling, yoga, reflection, etc.  Purposeful attention to your emotions and mental state can significantly improve your overall wellbeing  and  Health.  Please know that I am here to help you with all of your health care goals and am happy to work with you to find a solution that works best for you.  The greatest advice I have received with any changes in life are to take it one step at a time, that even means if all you can focus on is the next 60 seconds, then do that and celebrate your victories.  With any changes in life, you will have set backs, and that is OK. The important thing to remember is, if you have a set back, it is not a failure, it is an opportunity to try again! Screening Testing Mammogram Every 1 -2 years based on history and risk factors Starting at age 15 Pap Smear Ages 21-39 every 3 years Ages 91-65 every 5 years with HPV testing More frequent testing may be required based on results and history Colon Cancer Screening Every 1-10 years based on test performed, risk factors, and history Starting at age 68 Bone Density Screening Every 2-10 years based on history Starting at age 4 for women Recommendations for men differ based on medication usage, history, and risk factors AAA Screening One time ultrasound Men 72-70 years old who have every smoked Lung Cancer Screening Low Dose Lung CT every 12 months Age 64-80 years with a 30 pack-year smoking history who still smoke or who have quit within the last 15 years   Screening Labs Routine  Labs: Complete Blood Count (CBC), Complete Metabolic Panel (CMP), Cholesterol (Lipid Panel) Every 6-12 months based on history and medications May be recommended more frequently based on current  conditions or previous results Hemoglobin A1c Lab Every 3-12 months based on history and previous results Starting at age 105 or earlier with diagnosis of diabetes, high cholesterol, BMI >26, and/or risk factors Frequent monitoring for patients with diabetes to ensure blood sugar control Thyroid Panel (TSH) Every 6 months based on history, symptoms, and risk factors May be repeated more often if on medication HIV One time testing for all patients 68 and older May be repeated more frequently for patients with increased risk factors or exposure Hepatitis C One time testing for all patients 50 and older May be repeated more frequently for patients with increased risk factors or exposure Gonorrhea, Chlamydia Every 12 months for all sexually active persons 13-24 years Additional monitoring may be recommended for those who are considered high risk or who have symptoms Every 12 months for any woman on birth control, regardless of sexual activity PSA Men 71-25 years old with risk factors Additional screening may be recommended from age 53-69 based on risk factors, symptoms, and history  Vaccine Recommendations Tetanus Booster All adults every 10 years Flu Vaccine All patients 6 months and older every year COVID Vaccine All patients 12 years and older Initial dosing with booster May recommend additional booster based on age and health history HPV Vaccine 2 doses all patients age 36-26 Dosing may be considered for patients over 26 Shingles Vaccine (Shingrix) 2 doses all adults 55 years and older Pneumonia (Pneumovax 2) All adults 65 years and older May recommend earlier dosing based on health history One year apart from Prevnar 17 Pneumonia (Prevnar 72) All adults 65 years and older Dosed 1 year  after Pneumovax 23 Pneumonia (Prevnar 20) One time alternative to the two dosing of 13 and 23 For all adults with initial dose of 23, 20 is recommended 1 year later For all adults with  initial dose of 13, 23 is still recommended as second option 1 year later

## 2023-01-23 LAB — CBC WITH DIFFERENTIAL/PLATELET
Basophils Absolute: 0 10*3/uL (ref 0.0–0.2)
Basos: 1 %
EOS (ABSOLUTE): 0.1 10*3/uL (ref 0.0–0.4)
Eos: 2 %
Hematocrit: 45.1 % (ref 34.0–46.6)
Hemoglobin: 14.7 g/dL (ref 11.1–15.9)
Immature Grans (Abs): 0 10*3/uL (ref 0.0–0.1)
Immature Granulocytes: 0 %
Lymphocytes Absolute: 2.5 10*3/uL (ref 0.7–3.1)
Lymphs: 39 %
MCH: 28.8 pg (ref 26.6–33.0)
MCHC: 32.6 g/dL (ref 31.5–35.7)
MCV: 88 fL (ref 79–97)
Monocytes Absolute: 0.4 10*3/uL (ref 0.1–0.9)
Monocytes: 7 %
Neutrophils Absolute: 3.2 10*3/uL (ref 1.4–7.0)
Neutrophils: 51 %
Platelets: 298 10*3/uL (ref 150–450)
RBC: 5.11 x10E6/uL (ref 3.77–5.28)
RDW: 13.2 % (ref 11.7–15.4)
WBC: 6.3 10*3/uL (ref 3.4–10.8)

## 2023-01-23 LAB — COMPREHENSIVE METABOLIC PANEL
ALT: 9 IU/L (ref 0–32)
AST: 16 IU/L (ref 0–40)
Albumin: 4.4 g/dL (ref 3.9–4.9)
Alkaline Phosphatase: 121 IU/L (ref 44–121)
BUN/Creatinine Ratio: 14 (ref 12–28)
BUN: 13 mg/dL (ref 8–27)
Bilirubin Total: 0.4 mg/dL (ref 0.0–1.2)
CO2: 23 mmol/L (ref 20–29)
Calcium: 9.7 mg/dL (ref 8.7–10.3)
Chloride: 101 mmol/L (ref 96–106)
Creatinine, Ser: 0.96 mg/dL (ref 0.57–1.00)
Globulin, Total: 2.6 g/dL (ref 1.5–4.5)
Glucose: 94 mg/dL (ref 70–99)
Potassium: 4.4 mmol/L (ref 3.5–5.2)
Sodium: 141 mmol/L (ref 134–144)
Total Protein: 7 g/dL (ref 6.0–8.5)
eGFR: 66 mL/min/{1.73_m2} (ref >59)

## 2023-01-23 LAB — HEMOGLOBIN A1C
Est. average glucose Bld gHb Est-mCnc: 123 mg/dL
Hgb A1c MFr Bld: 5.9 % — ABNORMAL HIGH (ref 4.8–5.6)

## 2023-01-23 LAB — LIPID PANEL
Cholesterol, Total: 236 mg/dL — ABNORMAL HIGH (ref 100–199)
HDL: 47 mg/dL (ref >39)
LDL CALC COMMENT:: 5 ratio — ABNORMAL HIGH (ref 0.0–4.4)
LDL Chol Calc (NIH): 151 mg/dL — ABNORMAL HIGH (ref 0–99)
Triglycerides: 208 mg/dL — ABNORMAL HIGH (ref 0–149)
VLDL Cholesterol Cal: 38 mg/dL (ref 5–40)

## 2023-01-23 LAB — VITAMIN B12: Vitamin B-12: 1362 pg/mL — ABNORMAL HIGH (ref 232–1245)

## 2023-01-23 LAB — VITAMIN D 25 HYDROXY (VIT D DEFICIENCY, FRACTURES): Vit D, 25-Hydroxy: 31.5 ng/mL (ref 30.0–100.0)

## 2023-01-26 NOTE — Assessment & Plan Note (Signed)
 Erosive gastritis likely exacerbated by NSAID use. Manages inflammation with occasional Advil. Discussed risks of regular NSAID use and considered topical diclofenac gel for localized pain relief. - Avoid regular NSAID use - Consider topical diclofenac gel for localized pain

## 2023-01-26 NOTE — Assessment & Plan Note (Signed)
 Migraines managed with sumatriptan  as a last resort. Frequency decreased to one every two weeks, potentially exacerbated by sinus issues. Discussed sumatriptan  use and effectiveness. - Continue sumatriptan  as needed - Refill sumatriptan  prescription (10 tablets per month)

## 2023-01-26 NOTE — Assessment & Plan Note (Signed)
 Sinus pain and pressure for a couple of weeks, starting around Thanksgiving. Symptoms include sinus headache, bloody nasal discharge, and upper teeth pain, likely secondary to a recent upper respiratory infection. No chest pain, shortness of breath, or fluid behind the ears. Discussed azithromycin  use and potential for yeast infections with antibiotics. - Prescribe azithromycin  - Advise to contact if symptoms do not improve by Monday

## 2023-01-26 NOTE — Assessment & Plan Note (Signed)
 Pain radiating from the lower back down the leg, sometimes causing foot numbness, likely due to inflammation of lower back joints, exacerbated by prolonged sitting. Symptoms improve with reclining. Discussed stretches, TENS unit, heat/ice application, topical diclofenac gel, and lidocaine patches. Advised to report worsening symptoms, leg weakness, bowel or bladder involvement, or significant pain. - Recommend stretches (e.g., figure-four stretch, child's pose, cat-cow) - Suggest use of a TENS unit - Consider topical diclofenac gel (Voltaren) - Advise use of heat or ice as needed - Recommend lidocaine patches if needed - Advise to report worsening symptoms, leg weakness, bowel or bladder involvement, or significant pain

## 2023-01-30 ENCOUNTER — Encounter: Payer: Self-pay | Admitting: Nurse Practitioner

## 2023-03-30 ENCOUNTER — Ambulatory Visit
Admission: RE | Admit: 2023-03-30 | Discharge: 2023-03-30 | Disposition: A | Discharge status: Home / Self Care | Payer: 59 | Source: Ambulatory Visit | Attending: Radiology | Admitting: Radiology

## 2023-03-30 DIAGNOSIS — Z8739 Personal history of other diseases of the musculoskeletal system and connective tissue: Secondary | ICD-10-CM | POA: Diagnosis present

## 2023-04-02 ENCOUNTER — Other Ambulatory Visit (HOSPITAL_COMMUNITY): Payer: Self-pay

## 2023-08-02 ENCOUNTER — Other Ambulatory Visit (HOSPITAL_COMMUNITY): Payer: Self-pay

## 2023-08-17 ENCOUNTER — Other Ambulatory Visit: Payer: Self-pay | Admitting: Nurse Practitioner

## 2023-08-17 ENCOUNTER — Telehealth: Payer: Self-pay

## 2023-08-17 ENCOUNTER — Other Ambulatory Visit: Payer: Self-pay

## 2023-08-17 DIAGNOSIS — G43909 Migraine, unspecified, not intractable, without status migrainosus: Secondary | ICD-10-CM

## 2023-08-17 DIAGNOSIS — Z0000 Encounter for general adult medical examination without abnormal findings: Secondary | ICD-10-CM

## 2023-08-17 DIAGNOSIS — E785 Hyperlipidemia, unspecified: Secondary | ICD-10-CM

## 2023-08-17 NOTE — Telephone Encounter (Signed)
 I called pt. And got her scheduled on 08/27/23 to recheck FLP.   Copied from CRM [id number]. Topic: Clinical - Request for Lab/Test Order >> Aug 17, 2023  8:49 AM Everette C wrote: Reason for CRM: The patient has called to request lab orders to follow up on their previous discussions regarding potential prescription of a statin therapy. Please contact the patient to discuss and schedule further when possible.   The patient shares that they discussed a possible prescription with their PCP roughly 6 months ago and would like to have labs done on either 7/25 or 8/1 if possible

## 2023-08-27 ENCOUNTER — Other Ambulatory Visit: Payer: Self-pay

## 2023-08-27 DIAGNOSIS — E785 Hyperlipidemia, unspecified: Secondary | ICD-10-CM

## 2023-08-28 LAB — LIPID PANEL
Chol/HDL Ratio: 5.5 ratio — ABNORMAL HIGH (ref 0.0–4.4)
Cholesterol, Total: 224 mg/dL — ABNORMAL HIGH (ref 100–199)
HDL: 41 mg/dL (ref >39)
LDL Chol Calc (NIH): 143 mg/dL — ABNORMAL HIGH (ref 0–99)
Triglycerides: 222 mg/dL — ABNORMAL HIGH (ref 0–149)
VLDL Cholesterol Cal: 40 mg/dL (ref 5–40)

## 2023-08-31 ENCOUNTER — Ambulatory Visit: Payer: Self-pay | Admitting: Nurse Practitioner

## 2023-10-11 ENCOUNTER — Encounter: Payer: Self-pay | Admitting: Family Medicine

## 2023-11-25 ENCOUNTER — Ambulatory Visit

## 2023-11-25 DIAGNOSIS — Z23 Encounter for immunization: Secondary | ICD-10-CM | POA: Diagnosis not present

## 2023-12-22 ENCOUNTER — Other Ambulatory Visit: Payer: Self-pay | Admitting: Nurse Practitioner

## 2023-12-22 DIAGNOSIS — Z1231 Encounter for screening mammogram for malignant neoplasm of breast: Secondary | ICD-10-CM

## 2024-01-17 ENCOUNTER — Ambulatory Visit: Admitting: Nurse Practitioner

## 2024-01-17 ENCOUNTER — Encounter: Payer: Self-pay | Admitting: Nurse Practitioner

## 2024-01-17 VITALS — BP 118/82 | HR 78 | Ht 64.5 in | Wt 239.6 lb

## 2024-01-17 DIAGNOSIS — E559 Vitamin D deficiency, unspecified: Secondary | ICD-10-CM

## 2024-01-17 DIAGNOSIS — E669 Obesity, unspecified: Secondary | ICD-10-CM | POA: Diagnosis not present

## 2024-01-17 DIAGNOSIS — Z23 Encounter for immunization: Secondary | ICD-10-CM | POA: Diagnosis not present

## 2024-01-17 DIAGNOSIS — R7303 Prediabetes: Secondary | ICD-10-CM

## 2024-01-17 DIAGNOSIS — Z8249 Family history of ischemic heart disease and other diseases of the circulatory system: Secondary | ICD-10-CM | POA: Diagnosis not present

## 2024-01-17 DIAGNOSIS — E785 Hyperlipidemia, unspecified: Secondary | ICD-10-CM

## 2024-01-17 DIAGNOSIS — Z0000 Encounter for general adult medical examination without abnormal findings: Secondary | ICD-10-CM | POA: Diagnosis not present

## 2024-01-17 DIAGNOSIS — G43909 Migraine, unspecified, not intractable, without status migrainosus: Secondary | ICD-10-CM

## 2024-01-17 NOTE — Assessment & Plan Note (Signed)
" °  Orders:   Hemoglobin A1c   CBC with Differential/Platelet   Comprehensive metabolic panel with GFR   Lipid panel  "

## 2024-01-17 NOTE — Assessment & Plan Note (Signed)
 Migraines well-controlled with sumatriptan  (Imitrex ). Reports occasional headaches possibly related to sinus issues and weather changes. Current medication regimen is effective. - Continue current sumatriptan  regimen - Advised to request refill if nearing end of current supply

## 2024-01-17 NOTE — Progress Notes (Signed)
 "  Catheline Doing, DNP, AGNP-c [facility] [address] [place], [place] [address] Main Office [phone] VISIT TYPE: CPE on 01/17/2024 Today's Vitals   01/17/24 0924  BP: 118/82  Pulse: 78  SpO2: 97%  Weight: 239 lb 9.6 oz (108.7 kg)  Height: 5' 4.5 (1.638 m)   Body mass index is 40.49 kg/m.  Wt Readings from Last 3 Encounters:  01/17/24 239 lb 9.6 oz (108.7 kg)  01/22/23 233 lb (105.7 kg)  01/21/23 234 lb (106.1 kg)     Subjective:  Annual Exam (CPE, fasting labs, got covid booster in [facility] in [place], discuss pneumonia shot with you, )  History of Present Illness Kayla Lewis is a 63 year old female who presents for an annual physical exam.  She has a mammogram scheduled for January 21, 2024, and recently received her COVID vaccine. She is considering the pneumonia vaccine.  She uses Imitrex  and sumatriptan  for migraines, which typically resolve within an hour. Recently, she has experienced severe headaches attributed to sinus issues and weather changes. She has several refills remaining for her migraine medications.  She is struggling with weight management despite walking after lunch, drinking one cup of black coffee, and consuming about 100 ounces of water daily. Her diet includes a large lunch or salad, apples as snacks, and sometimes skipping dinner. She maintains a regular meal schedule with breakfast around 6:30-7:00 AM, lunch from 12:00-1:00 PM, and dinner between 5:30-6:00 PM. She occasionally drinks a small soda and goes to bed by 8:00 PM. She has a history of slightly elevated A1c levels, previously recorded at 5.8-5.9, and is concerned this may be affecting her weight. She consumes psyllium husk daily for fiber intake.  No fullness in the throat, difficulty swallowing, hearing or vision concerns, shortness of breath, chest pain, dizziness, changes in bowel or bladder habits, or vaginal bleeding. She reports occasional swelling in her feet  and ankles, which resolves overnight, and experiences sinus drainage. No significant reflux issues, managing occasional heartburn with Tums.  Pertinent items are noted in HPI.     01/17/2024    9:24 AM 01/22/2023   10:28 AM 01/21/2023    8:42 AM 05/20/2022    3:24 PM 07/28/2017    8:28 AM  Depression screen PHQ 2/9  Decreased Interest 0 0 0 0 0  Down, Depressed, Hopeless 0 0 0 0 0  PHQ - 2 Score 0 0 0 0 0        No data to display             01/17/2024    9:24 AM 01/22/2023   10:28 AM 05/20/2022    3:24 PM 07/28/2017    8:28 AM 10/01/2016    4:04 PM  Fall Risk   Falls in the past year? 0 0 0 No  No   Number falls in past yr: 0 0 0    Injury with Fall? 0 0  0     Risk for fall due to : No Fall Risks  No Fall Risks    Follow up Falls evaluation completed  Falls evaluation completed       Data saved with a previous flowsheet row definition   Past medical history, surgical history, medications, allergies, family history and social history reviewed with patient today and changes made to appropriate areas of the chart.      Objective:    Physical Exam Vitals and nursing note reviewed.  Constitutional:      General:  She is not in acute distress.    Appearance: Normal appearance.  HENT:     Head: Normocephalic and atraumatic.     Right Ear: Hearing, tympanic membrane, ear canal and external ear normal.     Left Ear: Hearing, tympanic membrane, ear canal and external ear normal.     Nose: Nose normal.     Right Sinus: No maxillary sinus tenderness or frontal sinus tenderness.     Left Sinus: No maxillary sinus tenderness or frontal sinus tenderness.     Mouth/Throat:     Lips: Pink.     Mouth: Mucous membranes are moist.     Pharynx: Oropharynx is clear.  Eyes:     General: Lids are normal. Vision grossly intact.     Extraocular Movements: Extraocular movements intact.     Conjunctiva/sclera: Conjunctivae normal.     Pupils: Pupils are equal, round, and reactive to  light.     Funduscopic exam:    Right eye: Red reflex present.        Left eye: Red reflex present.    Visual Fields: Right eye visual fields normal and left eye visual fields normal.  Neck:     Thyroid: No thyromegaly.     Vascular: No carotid bruit.  Cardiovascular:     Rate and Rhythm: Normal rate and regular rhythm.     Chest Wall: PMI is not displaced.     Pulses: Normal pulses.          Dorsalis pedis pulses are 2+ on the right side and 2+ on the left side.       Posterior tibial pulses are 2+ on the right side and 2+ on the left side.     Heart sounds: Normal heart sounds. No murmur heard. Pulmonary:     Effort: Pulmonary effort is normal. No respiratory distress.     Breath sounds: Normal breath sounds.  Abdominal:     General: Abdomen is flat. Bowel sounds are normal. There is no distension.     Palpations: Abdomen is soft. There is no hepatomegaly, splenomegaly or mass.     Tenderness: There is no abdominal tenderness. There is no right CVA tenderness, left CVA tenderness, guarding or rebound.  Musculoskeletal:        General: Normal range of motion.     Cervical back: Full passive range of motion without pain, normal range of motion and neck supple. No tenderness.     Right lower leg: No edema.     Left lower leg: No edema.  Feet:     Left foot:     Toenail Condition: Left toenails are normal.  Lymphadenopathy:     Cervical: No cervical adenopathy.     Upper Body:     Right upper body: No supraclavicular adenopathy.     Left upper body: No supraclavicular adenopathy.  Skin:    General: Skin is warm and dry.     Capillary Refill: Capillary refill takes less than 2 seconds.     Nails: There is no clubbing.  Neurological:     General: No focal deficit present.     Mental Status: She is alert and oriented to person, place, and time.     GCS: GCS eye subscore is 4. GCS verbal subscore is 5. GCS motor subscore is 6.     Sensory: Sensation is intact.     Motor: Motor  function is intact.     Coordination: Coordination is intact.  Gait: Gait is intact.     Deep Tendon Reflexes: Reflexes are normal and symmetric.  Psychiatric:        Attention and Perception: Attention normal.        Mood and Affect: Mood normal.        Speech: Speech normal.        Behavior: Behavior normal. Behavior is cooperative.        Thought Content: Thought content normal.        Cognition and Memory: Cognition and memory normal.        Judgment: Judgment normal.         Assessment & Plan:   Assessment & Plan Encounter for annual physical exam CPE completed today. Review of HM activities and recommendations discussed and provided on AVS. Anticipatory guidance, diet, and exercise recommendations provided. Medications, allergies, and hx reviewed and updated as necessary. Orders placed as listed below. Diet and exercise management reviewed and recommendations discussed. Excellent exercise and water intake habits are in place.  Plan: - Labs ordered. Will make changes as necessary based on results.  - I will review these results and send recommendations via MyChart or a telephone call.  - PNA20 gven today - F/U with CPE in 1 year or sooner for acute/chronic health needs as directed.      Prediabetes A1c previously recorded at 5.8-5.9, slightly above normal range. Discussed potential impact of blood sugar levels on weight management. Consideration of metformin to stabilize blood sugars and aid in weight loss. Will monitor blood sugar levels and determine appropriate next steps. If A1c >6.4%, consider discussion of GLP-1 - Ordered labs to reassess A1c - Will consider metformin if A1c remains elevated Orders:   Hemoglobin A1c   CBC with Differential/Platelet   Comprehensive metabolic panel with GFR   Lipid panel  Family history of heart disease in female family member before age 64  Orders:   Hemoglobin A1c   CBC with Differential/Platelet   Comprehensive metabolic  panel with GFR   Lipid panel  Obesity (BMI 30-39.9) Struggling with weight management despite regular exercise and a healthy diet. Possible contributing factors include age-related metabolic changes and potential blood sugar issues. Discussed intermittent fasting and blood sugar control. Consideration of metformin for weight management if A1c remains elevated. Discussed potential side effects of metformin, including GI upset and diarrhea. - Ordered labs to assess A1c and other relevant parameters - Provided handout on high protein, low carb snacks - Will consider metformin if A1c remains elevated Orders:   Hemoglobin A1c   CBC with Differential/Platelet   Comprehensive metabolic panel with GFR   Lipid panel  Dyslipidemia Lab orders placed today for monitoring. Currently managed with diet and exercise alone.  Orders:   Hemoglobin A1c   CBC with Differential/Platelet   Comprehensive metabolic panel with GFR   Lipid panel  Vitamin D  deficiency  Orders:   Comprehensive metabolic panel with GFR   VITAMIN D  25 Hydroxy (Vit-D Deficiency, Fractures)  Migraine without status migrainosus, not intractable, unspecified migraine type Migraines well-controlled with sumatriptan  (Imitrex ). Reports occasional headaches possibly related to sinus issues and weather changes. Current medication regimen is effective. - Continue current sumatriptan  regimen - Advised to request refill if nearing end of current supply    Need for vaccination against Streptococcus pneumoniae  Orders:   Pneumococcal conjugate vaccine 20-valent (Prevnar 20)  NEXT PREVENTATIVE PHYSICAL DUE IN 1 YEAR.    PATIENT COUNSELING PROVIDED FOR ALL ADULT PATIENTS: A well balanced diet low in  saturated fats, cholesterol, and moderation in carbohydrates.  This can be as simple as monitoring portion sizes and cutting back on sugary beverages such as soda and juice to start with.    Daily water consumption of at least 64  ounces.  Physical activity at least 180 minutes per week.  If just starting out, start 10 minutes a day and work your way up.   This can be as simple as taking the stairs instead of the elevator and walking 2-3 laps around the office  purposefully every day.   STD protection, partner selection, and regular testing if high risk.  Limited consumption of alcoholic beverages if alcohol is consumed. For men, I recommend no more than 14 alcoholic beverages per week, spread out throughout the week (max 2 per day). Avoid binge drinking or consuming large quantities of alcohol in one setting.  Please let me know if you feel you may need help with reduction or quitting alcohol consumption.   Avoidance of nicotine, if used. Please let me know if you feel you may need help with reduction or quitting nicotine use.   Daily mental health attention. This can be in the form of 5 minute daily meditation, prayer, journaling, yoga, reflection, etc.  Purposeful attention to your emotions and mental state can significantly improve your overall wellbeing  and  Health.  Please know that I am here to help you with all of your health care goals and am happy to work with you to find a solution that works best for you.  The greatest advice I have received with any changes in life are to take it one step at a time, that even means if all you can focus on is the next 60 seconds, then do that and celebrate your victories.  With any changes in life, you will have set backs, and that is OK. The important thing to remember is, if you have a set back, it is not a failure, it is an opportunity to try again! Screening Testing Mammogram Every 1 -2 years based on history and risk factors Starting at age 34 Pap Smear Ages 21-39 every 3 years Ages 73-65 every 5 years with HPV testing More frequent testing may be required based on results and history Colon Cancer Screening Every 1-10 years based on test performed, risk  factors, and history Starting at age 57 Bone Density Screening Every 2-10 years based on history Starting at age 84 for women Recommendations for men differ based on medication usage, history, and risk factors AAA Screening One time ultrasound Men 27-79 years old who have every smoked Lung Cancer Screening Low Dose Lung CT every 12 months Age 72-80 years with a 30 pack-year smoking history who still smoke or who have quit within the last 15 years   Screening Labs Routine  Labs: Complete Blood Count (CBC), Complete Metabolic Panel (CMP), Cholesterol (Lipid Panel) Every 6-12 months based on history and medications May be recommended more frequently based on current conditions or previous results Hemoglobin A1c Lab Every 3-12 months based on history and previous results Starting at age 22 or earlier with diagnosis of diabetes, high cholesterol, BMI >26, and/or risk factors Frequent monitoring for patients with diabetes to ensure blood sugar control Thyroid Panel (TSH) Every 6 months based on history, symptoms, and risk factors May be repeated more often if on medication HIV One time testing for all patients 48 and older May be repeated more frequently for patients with increased risk factors or exposure  Hepatitis C One time testing for all patients 19 and older May be repeated more frequently for patients with increased risk factors or exposure Gonorrhea, Chlamydia Every 12 months for all sexually active persons 13-24 years Additional monitoring may be recommended for those who are considered high risk or who have symptoms Every 12 months for any woman on birth control, regardless of sexual activity PSA Men 34-39 years old with risk factors Additional screening may be recommended from age 46-69 based on risk factors, symptoms, and history  Vaccine Recommendations Tetanus Booster All adults every 10 years Flu Vaccine All patients 6 months and older every year COVID  Vaccine All patients 12 years and older Initial dosing with booster May recommend additional booster based on age and health history HPV Vaccine 2 doses all patients age 79-26 Dosing may be considered for patients over 26 Shingles Vaccine (Shingrix ) 2 doses all adults 55 years and older Pneumonia (Pneumovax 23) All adults 65 years and older May recommend earlier dosing based on health history One year apart from Prevnar 13 Pneumonia (Prevnar 28) All adults 65 years and older Dosed 1 year after Pneumovax 23 Pneumonia (Prevnar 20) One time alternative to the two dosing of 13 and 23 For all adults with initial dose of 23, 20 is recommended 1 year later For all adults with initial dose of 13, 23 is still recommended as second option 1 year later        "

## 2024-01-17 NOTE — Assessment & Plan Note (Signed)
 A1c previously recorded at 5.8-5.9, slightly above normal range. Discussed potential impact of blood sugar levels on weight management. Consideration of metformin to stabilize blood sugars and aid in weight loss. Will monitor blood sugar levels and determine appropriate next steps. If A1c >6.4%, consider discussion of GLP-1 - Ordered labs to reassess A1c - Will consider metformin if A1c remains elevated Orders:   Hemoglobin A1c   CBC with Differential/Platelet   Comprehensive metabolic panel with GFR   Lipid panel

## 2024-01-17 NOTE — Assessment & Plan Note (Signed)
 CPE completed today. Review of HM activities and recommendations discussed and provided on AVS. Anticipatory guidance, diet, and exercise recommendations provided. Medications, allergies, and hx reviewed and updated as necessary. Orders placed as listed below. Diet and exercise management reviewed and recommendations discussed. Excellent exercise and water intake habits are in place.  Plan: - Labs ordered. Will make changes as necessary based on results.  - I will review these results and send recommendations via MyChart or a telephone call.  - PNA20 gven today - F/U with CPE in 1 year or sooner for acute/chronic health needs as directed.

## 2024-01-17 NOTE — Patient Instructions (Addendum)
 If you start to get low on refills for the sumatriptan , please reach out and I am happy to send this in for you.   We will check your labs today and I will let you know how things are looking. If your blood sugar is still up, we can certainly consider metformin to see if this helps with your weight loss efforts.    For all adult patients, I recommend A well balanced diet low in saturated fats, cholesterol, and moderation in carbohydrates.   This can be as simple as monitoring portion sizes and cutting back on sugary beverages such as soda and juice to start with.    Daily water consumption of at least 64 ounces.  Physical activity at least 180 minutes per week, if just starting out.   This can be as simple as taking the stairs instead of the elevator and walking 2-3 laps around the office  purposefully every day.   STD protection, partner selection, and regular testing if high risk.  Limited consumption of alcoholic beverages if alcohol is consumed.  For women, I recommend no more than 7 alcoholic beverages per week, spread out throughout the week.  Avoid binge drinking or consuming large quantities of alcohol in one setting.   Please let me know if you feel you may need help with reduction or quitting alcohol consumption.   Avoidance of nicotine, if used.  Please let me know if you feel you may need help with reduction or quitting nicotine use.   Daily mental health attention.  This can be in the form of 5 minute daily meditation, prayer, journaling, yoga, reflection, etc.   Purposeful attention to your emotions and mental state can significantly improve your overall wellbeing  and  Health.  Please know that I am here to help you with all of your health care goals and am happy to work with you to find a solution that works best for you.  The greatest advice I have received with any changes in life are to take it one step at a time, that even means if all you can focus on is the next  60 seconds, then do that and celebrate your victories.  With any changes in life, you will have set backs, and that is OK. The important thing to remember is, if you have a set back, it is not a failure, it is an opportunity to try again!  Health Maintenance Recommendations Screening Testing Mammogram Every 1 -2 years based on history and risk factors Starting at age 43 Pap Smear Ages 21-39 every 3 years Ages 5-65 every 5 years with HPV testing More frequent testing may be required based on results and history Colon Cancer Screening Every 1-10 years based on test performed, risk factors, and history Starting at age 43 Bone Density Screening Every 2-10 years based on history Starting at age 63 for women Recommendations for men differ based on medication usage, history, and risk factors AAA Screening One time ultrasound Men 28-64 years old who have every smoked Lung Cancer Screening Low Dose Lung CT every 12 months Age 23-80 years with a 30 pack-year smoking history who still smoke or who have quit within the last 15 years  Screening Labs Routine  Labs: Complete Blood Count (CBC), Complete Metabolic Panel (CMP), Cholesterol (Lipid Panel) Every 6-12 months based on history and medications May be recommended more frequently based on current conditions or previous results Hemoglobin A1c Lab Every 3-12 months based on history and previous  results Starting at age 18 or earlier with diagnosis of diabetes, high cholesterol, BMI >26, and/or risk factors Frequent monitoring for patients with diabetes to ensure blood sugar control Thyroid Panel (TSH w/ T3 & T4) Every 6 months based on history, symptoms, and risk factors May be repeated more often if on medication HIV One time testing for all patients 74 and older May be repeated more frequently for patients with increased risk factors or exposure Hepatitis C One time testing for all patients 75 and older May be repeated more frequently  for patients with increased risk factors or exposure Gonorrhea, Chlamydia Every 12 months for all sexually active persons 13-24 years Additional monitoring may be recommended for those who are considered high risk or who have symptoms PSA Men 66-34 years old with risk factors Additional screening may be recommended from age 15-69 based on risk factors, symptoms, and history  Vaccine Recommendations Tetanus Booster All adults every 10 years Flu Vaccine All patients 6 months and older every year COVID Vaccine All patients 12 years and older Initial dosing with booster May recommend additional booster based on age and health history HPV Vaccine 2 doses all patients age 47-26 Dosing may be considered for patients over 26 Shingles Vaccine (Shingrix ) 2 doses all adults 55 years and older Pneumonia (Pneumovax 23) All adults 65 years and older May recommend earlier dosing based on health history Pneumonia (Prevnar 46) All adults 65 years and older Dosed 1 year after Pneumovax 23  Additional Screening, Testing, and Vaccinations may be recommended on an individualized basis based on family history, health history, risk factors, and/or exposure.     WEIGHT LOSS PLANNING   For best management of weight, it is vital to balance intake versus output. This means the number of calories burned per day must be less than the calories you take in with food and drink.   I recommend trying to follow a diet with the following: Calories: 1500 calories per day Carbohydrates: 150-180 grams of carbohydrates per day  Why: Gives your body enough quick fuel for cells to maintain normal function without sending them into starvation mode.  Protein: At least 90 grams of protein per day- 30 grams with each meal Why: Protein takes longer and uses more energy than carbohydrates to break down for fuel. The carbohydrates in your meals serves as quick energy sources and proteins help use some of that extra quick  energy to break down to produce long term energy. This helps you not feel hungry as quickly and protein breakdown burns calories.  Water: Drink AT LEAST 64 ounces of water per day  Why: Water is essential to healthy metabolism. Water helps to fill the stomach and keep you fuller longer. Water is required for healthy digestion and filtering of waste in the body.  Fat: Limit fats in your diet- when choosing fats, choose foods with lower fats content such as lean meats (chicken, fish, turkey).  Why: Increased fat intake leads to storage for later. Once you burn your carbohydrate energy, your body goes into fat and protein breakdown mode to help you loose weight.  Cholesterol: Fats and oils that are LIQUID at room temperature are best. Choose vegetable oils (olive oil, avocado oil, nuts). Avoid fats that are SOLID at room temperature (animal fats, processed meats). Healthy fats are often found in whole grains, beans, nuts, seeds, and berries.  Why: Elevated cholesterol levels lead to build up of cholesterol on the inside of your blood vessels. This will eventually  cause the blood vessels to become hard and can lead to high blood pressure and damage to your organs. When the blood flow is reduced, but the pressure is high from cholesterol buildup, parts of the cholesterol can break off and form clots that can go to the brain or heart leading to a stroke or heart attack.  Fiber: Increase amount of SOLUBLE the fiber in your diet. This helps to fill you up, lowers cholesterol, and helps with digestion. Some foods high in soluble fiber are oats, peas, beans, apples, carrots, barley, and citrus fruits.   Why: Fiber fills you up, helps remove excess cholesterol, and aids in healthy digestion which are all very important in weight management.   I recommend the following as a minimum activity routine: Purposeful walk or other physical activity at least 20 minutes every single day. This means purposefully taking a  walk, jog, bike, swim, treadmill, elliptical, dance, etc.  This activity should be ABOVE your normal daily activities, such as walking at work. Goal exercise should be at least 150 minutes a week- work your way up to this.   Heart Rate: Your maximum exercise heart rate should be 220 - Your Age in Years. When exercising, get your heart rate up, but avoid going over the maximum targeted heart rate.  60-70% of your maximum heart rate is where you tend to burn the most fat. To find this number:  220 - Age In Years= Max HR  Max HR x 0.6 (or 0.7) = Fat Burning HR The Fat Burning HR is your goal heart rate while working out to burn the most fat.  NEVER exercise to the point your feel lightheaded, weak, nauseated, dizzy. If you experience ANY of these symptoms- STOP exercise! Allow yourself to cool down and your heart rate to come down. Then restart slower next time.  If at ANY TIME you feel chest pain or chest pressure during exercise, STOP IMMEDIATELY and seek medical attention.

## 2024-01-17 NOTE — Assessment & Plan Note (Signed)
 Struggling with weight management despite regular exercise and a healthy diet. Possible contributing factors include age-related metabolic changes and potential blood sugar issues. Discussed intermittent fasting and blood sugar control. Consideration of metformin for weight management if A1c remains elevated. Discussed potential side effects of metformin, including GI upset and diarrhea. - Ordered labs to assess A1c and other relevant parameters - Provided handout on high protein, low carb snacks - Will consider metformin if A1c remains elevated Orders:   Hemoglobin A1c   CBC with Differential/Platelet   Comprehensive metabolic panel with GFR   Lipid panel

## 2024-01-17 NOTE — Assessment & Plan Note (Signed)
 Lab orders placed today for monitoring. Currently managed with diet and exercise alone.  Orders:   Hemoglobin A1c   CBC with Differential/Platelet   Comprehensive metabolic panel with GFR   Lipid panel

## 2024-01-18 LAB — COMPREHENSIVE METABOLIC PANEL WITH GFR
ALT: 8 IU/L (ref 0–32)
AST: 18 IU/L (ref 0–40)
Albumin: 4.3 g/dL (ref 3.9–4.9)
Alkaline Phosphatase: 114 IU/L (ref 49–135)
BUN/Creatinine Ratio: 12 (ref 12–28)
BUN: 13 mg/dL (ref 8–27)
Bilirubin Total: 0.4 mg/dL (ref 0.0–1.2)
CO2: 23 mmol/L (ref 20–29)
Calcium: 10 mg/dL (ref 8.7–10.3)
Chloride: 103 mmol/L (ref 96–106)
Creatinine, Ser: 1.11 mg/dL — ABNORMAL HIGH (ref 0.57–1.00)
Globulin, Total: 2.5 g/dL (ref 1.5–4.5)
Glucose: 92 mg/dL (ref 70–99)
Potassium: 4.4 mmol/L (ref 3.5–5.2)
Sodium: 142 mmol/L (ref 134–144)
Total Protein: 6.8 g/dL (ref 6.0–8.5)
eGFR: 56 mL/min/1.73 — ABNORMAL LOW

## 2024-01-18 LAB — CBC WITH DIFFERENTIAL/PLATELET
Basophils Absolute: 0 x10E3/uL (ref 0.0–0.2)
Basos: 1 %
EOS (ABSOLUTE): 0.1 x10E3/uL (ref 0.0–0.4)
Eos: 2 %
Hematocrit: 42.2 % (ref 34.0–46.6)
Hemoglobin: 13.9 g/dL (ref 11.1–15.9)
Immature Grans (Abs): 0 x10E3/uL (ref 0.0–0.1)
Immature Granulocytes: 0 %
Lymphocytes Absolute: 2.2 x10E3/uL (ref 0.7–3.1)
Lymphs: 38 %
MCH: 29 pg (ref 26.6–33.0)
MCHC: 32.9 g/dL (ref 31.5–35.7)
MCV: 88 fL (ref 79–97)
Monocytes Absolute: 0.4 x10E3/uL (ref 0.1–0.9)
Monocytes: 6 %
Neutrophils Absolute: 3.1 x10E3/uL (ref 1.4–7.0)
Neutrophils: 53 %
Platelets: 289 x10E3/uL (ref 150–450)
RBC: 4.8 x10E6/uL (ref 3.77–5.28)
RDW: 12.9 % (ref 11.7–15.4)
WBC: 5.8 x10E3/uL (ref 3.4–10.8)

## 2024-01-18 LAB — LIPID PANEL
Chol/HDL Ratio: 4.8 ratio — ABNORMAL HIGH (ref 0.0–4.4)
Cholesterol, Total: 217 mg/dL — ABNORMAL HIGH (ref 100–199)
HDL: 45 mg/dL
LDL Chol Calc (NIH): 128 mg/dL — ABNORMAL HIGH (ref 0–99)
Triglycerides: 248 mg/dL — ABNORMAL HIGH (ref 0–149)
VLDL Cholesterol Cal: 44 mg/dL — ABNORMAL HIGH (ref 5–40)

## 2024-01-18 LAB — VITAMIN D 25 HYDROXY (VIT D DEFICIENCY, FRACTURES): Vit D, 25-Hydroxy: 31.7 ng/mL (ref 30.0–100.0)

## 2024-01-18 LAB — HEMOGLOBIN A1C
Est. average glucose Bld gHb Est-mCnc: 117 mg/dL
Hgb A1c MFr Bld: 5.7 % — ABNORMAL HIGH (ref 4.8–5.6)

## 2024-01-21 ENCOUNTER — Ambulatory Visit
Admission: RE | Admit: 2024-01-21 | Discharge: 2024-01-21 | Disposition: A | Discharge status: Home / Self Care | Source: Ambulatory Visit

## 2024-01-21 DIAGNOSIS — Z1231 Encounter for screening mammogram for malignant neoplasm of breast: Secondary | ICD-10-CM

## 2024-01-25 ENCOUNTER — Encounter: Payer: BC Managed Care – PPO | Admitting: Nurse Practitioner

## 2024-01-26 ENCOUNTER — Ambulatory Visit: Payer: Self-pay | Admitting: Nurse Practitioner

## 2024-01-26 ENCOUNTER — Encounter: Payer: Self-pay | Admitting: Nurse Practitioner

## 2024-01-26 DIAGNOSIS — R7989 Other specified abnormal findings of blood chemistry: Secondary | ICD-10-CM

## 2024-01-26 DIAGNOSIS — R7303 Prediabetes: Secondary | ICD-10-CM

## 2024-01-26 DIAGNOSIS — E669 Obesity, unspecified: Secondary | ICD-10-CM

## 2024-01-31 MED ORDER — METFORMIN HCL 500 MG PO TABS: 500.0000 mg | ORAL_TABLET | Freq: Two times a day (BID) | ORAL | 1 refills | Status: AC

## 2024-05-05 ENCOUNTER — Other Ambulatory Visit

## 2025-01-16 ENCOUNTER — Encounter: Admitting: Nurse Practitioner
